# Patient Record
Sex: Female | Born: 1955
Health system: Southern US, Community
[De-identification: ages and names within clinical notes are randomized; demographics above are authoritative.]

## PROBLEM LIST (undated history)

## (undated) DIAGNOSIS — E7800 Pure hypercholesterolemia, unspecified: Secondary | ICD-10-CM

## (undated) DIAGNOSIS — C439 Malignant melanoma of skin, unspecified: Secondary | ICD-10-CM

## (undated) DIAGNOSIS — I10 Essential (primary) hypertension: Secondary | ICD-10-CM

## (undated) DIAGNOSIS — R51 Headache: Secondary | ICD-10-CM

## (undated) DIAGNOSIS — Z9981 Dependence on supplemental oxygen: Secondary | ICD-10-CM

## (undated) DIAGNOSIS — M67874 Other specified disorders of tendon, left ankle and foot: Secondary | ICD-10-CM

## (undated) DIAGNOSIS — C179 Malignant neoplasm of small intestine, unspecified: Secondary | ICD-10-CM

## (undated) HISTORY — PX: APPENDECTOMY: SHX54

## (undated) HISTORY — DX: Other specified disorders of tendon, left ankle and foot: M67.874

## (undated) HISTORY — DX: Malignant neoplasm of small intestine, unspecified: C17.9

## (undated) HISTORY — PX: OTHER SURGICAL HISTORY: SHX169

## (undated) HISTORY — PX: KNEE ARTHROSCOPY: SUR90

## (undated) HISTORY — DX: Headache: R51

## (undated) HISTORY — DX: Pure hypercholesterolemia, unspecified: E78.00

## (undated) HISTORY — DX: Malignant melanoma of skin, unspecified: C43.9

---

## 1996-11-13 DIAGNOSIS — C439 Malignant melanoma of skin, unspecified: Secondary | ICD-10-CM

## 1996-11-13 HISTORY — DX: Malignant melanoma of skin, unspecified: C43.9

## 1998-02-15 ENCOUNTER — Ambulatory Visit (HOSPITAL_COMMUNITY): Admission: RE | Admit: 1998-02-15 | Discharge: 1998-02-15 | Payer: Self-pay | Admitting: Obstetrics and Gynecology

## 2000-07-05 ENCOUNTER — Other Ambulatory Visit: Admission: RE | Admit: 2000-07-05 | Discharge: 2000-07-05 | Payer: Self-pay | Admitting: Obstetrics and Gynecology

## 2000-07-23 ENCOUNTER — Ambulatory Visit (HOSPITAL_COMMUNITY): Admission: RE | Admit: 2000-07-23 | Discharge: 2000-07-23 | Payer: Self-pay | Admitting: Obstetrics and Gynecology

## 2000-11-13 DIAGNOSIS — C179 Malignant neoplasm of small intestine, unspecified: Secondary | ICD-10-CM

## 2000-11-13 HISTORY — DX: Malignant neoplasm of small intestine, unspecified: C17.9

## 2001-02-06 ENCOUNTER — Encounter: Payer: Self-pay | Admitting: General Surgery

## 2001-02-06 ENCOUNTER — Inpatient Hospital Stay (HOSPITAL_COMMUNITY): Admission: EM | Admit: 2001-02-06 | Discharge: 2001-02-14 | Payer: Self-pay

## 2001-03-08 ENCOUNTER — Ambulatory Visit (HOSPITAL_COMMUNITY): Admission: RE | Admit: 2001-03-08 | Discharge: 2001-03-08 | Payer: Self-pay | Admitting: *Deleted

## 2001-03-08 ENCOUNTER — Encounter: Payer: Self-pay | Admitting: *Deleted

## 2001-07-16 ENCOUNTER — Other Ambulatory Visit: Admission: RE | Admit: 2001-07-16 | Discharge: 2001-07-16 | Payer: Self-pay | Admitting: Obstetrics and Gynecology

## 2002-09-02 ENCOUNTER — Other Ambulatory Visit: Admission: RE | Admit: 2002-09-02 | Discharge: 2002-09-02 | Payer: Self-pay | Admitting: Obstetrics and Gynecology

## 2005-05-30 ENCOUNTER — Other Ambulatory Visit: Admission: RE | Admit: 2005-05-30 | Discharge: 2005-05-30 | Payer: Self-pay | Admitting: Obstetrics and Gynecology

## 2006-07-05 ENCOUNTER — Ambulatory Visit: Payer: Self-pay | Admitting: Gastroenterology

## 2006-07-23 ENCOUNTER — Ambulatory Visit: Payer: Self-pay | Admitting: Gastroenterology

## 2010-03-18 DIAGNOSIS — H70009 Acute mastoiditis without complications, unspecified ear: Secondary | ICD-10-CM | POA: Insufficient documentation

## 2010-03-18 DIAGNOSIS — K5730 Diverticulosis of large intestine without perforation or abscess without bleeding: Secondary | ICD-10-CM | POA: Insufficient documentation

## 2010-08-12 DIAGNOSIS — Z2839 Other underimmunization status: Secondary | ICD-10-CM | POA: Insufficient documentation

## 2010-08-12 DIAGNOSIS — R3129 Other microscopic hematuria: Secondary | ICD-10-CM | POA: Insufficient documentation

## 2010-08-12 DIAGNOSIS — R030 Elevated blood-pressure reading, without diagnosis of hypertension: Secondary | ICD-10-CM | POA: Insufficient documentation

## 2011-08-21 DIAGNOSIS — E669 Obesity, unspecified: Secondary | ICD-10-CM | POA: Insufficient documentation

## 2011-11-14 DIAGNOSIS — R519 Headache, unspecified: Secondary | ICD-10-CM

## 2011-11-14 HISTORY — DX: Headache, unspecified: R51.9

## 2012-02-20 DIAGNOSIS — J189 Pneumonia, unspecified organism: Secondary | ICD-10-CM | POA: Insufficient documentation

## 2012-07-18 DIAGNOSIS — M5136 Other intervertebral disc degeneration, lumbar region: Secondary | ICD-10-CM | POA: Insufficient documentation

## 2012-11-27 DIAGNOSIS — I872 Venous insufficiency (chronic) (peripheral): Secondary | ICD-10-CM | POA: Insufficient documentation

## 2013-09-10 DIAGNOSIS — Z85820 Personal history of malignant melanoma of skin: Secondary | ICD-10-CM | POA: Insufficient documentation

## 2013-12-23 DIAGNOSIS — IMO0002 Reserved for concepts with insufficient information to code with codable children: Secondary | ICD-10-CM | POA: Insufficient documentation

## 2014-07-13 DIAGNOSIS — C4359 Malignant melanoma of other part of trunk: Secondary | ICD-10-CM | POA: Insufficient documentation

## 2014-07-31 DIAGNOSIS — L729 Follicular cyst of the skin and subcutaneous tissue, unspecified: Secondary | ICD-10-CM | POA: Insufficient documentation

## 2014-12-23 ENCOUNTER — Ambulatory Visit (INDEPENDENT_AMBULATORY_CARE_PROVIDER_SITE_OTHER): Payer: 59 | Admitting: Neurology

## 2014-12-23 ENCOUNTER — Telehealth: Payer: Self-pay

## 2014-12-23 ENCOUNTER — Encounter: Payer: Self-pay | Admitting: Neurology

## 2014-12-23 VITALS — BP 159/78 | HR 65 | Resp 14 | Ht 65.5 in | Wt 201.2 lb

## 2014-12-23 DIAGNOSIS — G4700 Insomnia, unspecified: Secondary | ICD-10-CM

## 2014-12-23 DIAGNOSIS — M1990 Unspecified osteoarthritis, unspecified site: Secondary | ICD-10-CM

## 2014-12-23 MED ORDER — AMITRIPTYLINE HCL 25 MG PO TABS: 25.0000 mg | ORAL_TABLET | Freq: Every day | ORAL | Status: DC

## 2014-12-23 NOTE — Telephone Encounter (Signed)
Rx has been re-sent to [place].

## 2014-12-23 NOTE — Progress Notes (Signed)
SLEEP MEDICINE CLINIC   Provider:  Larey Seat, M D  Referring Provider: Leanna Battles, MD Primary Care Physician:  No primary care provider on file.  Chief Complaint  Patient presents with  . NP [facility] Sleep consult    Rm 10, alone    HPI:  MALEAH Warren is a 59 y.o. caucasain, married female  And  seen here as a referral  from Dr. Philip Aspen for a sleep evaluation.   The patient reports that until 2012 she actually complained about inability to stay awake she would come home from work and literally had to fight sleepiness at the dinner table. Sometimes she would fall asleep in situations that she didn't intend to. Then a change happened and she states that she became more insomniac by the year 2012. There have been no medical condition or anything else that she could relate to triggering this change may be menopause. What was really am important to mention is that she had and left ankle surgery in 2014 and her orthopedist gave her alprazolam to help her sleep she took this medication for about a year and it worked fine for her but she now is in the process of slowly weaning herself to lower and lower doses. She did she stated that she doesn't feel that she is worried that her mind is racing at night she is completely relaxed she is also not overtly sleeping and daytime even if she didn't get a lot of sleep at night.  Her sleep habits are as follows: The patient relies on an alarm in the morning and rises usually around 8:00. She will have a small breakfast and drinks coffee in the morning. She commutes 20 minutes by car to work. At work she works in a [employer] she is not exposed to any daylight during these hours. She works 8 hours daily the earliest is usually 10 AM beginning, to 7 PM - but in some days she may be called in for a 1-10 shift. She yawns at work, but doesn't feel sleepy. She drinks rareley coffee in daytime. She never naps. She goes to bed at 11.30 Pm and  takes her benzo at 10 Pm. She will sleep through the night. No coturia, some snoring. Husband has confirmed this, but sleeps in a different bedroom. She wakes up restored in AM, no headches and no dry mouth.    In addition the patient stated that she begun having headaches in the year 2013 these headaches have occurred every day and seem to occur at the same time in onset that began as a couple of days per month and now are a daily headache. It used to started 1 PM now it stopped at 11 AM and she feels a pressure and throbbing in her right forehead and temple right above the eyebrow. Headaches last about 20 minutes they're not associated with nausea and they're not associated with photophobia. A CT was normal in 2013.      Review of Systems: Out of a complete 14 system review, the patient complains of only the following symptoms, and all other reviewed systems are negative. Snoring, insomnia, headaches.   Epworth score 1  , Fatigue severity score 11 ,  Geriatric depression score 3 points    History   Social History  . Marital Status: Married    Spouse Name: N/A  . Number of Children: 0  . Years of Education: HS grad   Occupational History  . Deli [employer]   Social History Main Topics  . Smoking status: Never Smoker   . Smokeless tobacco: Not on file  . Alcohol Use: 0.0 oz/week    0 Standard drinks or equivalent per week     Comment: occ  . Drug Use: No  . Sexual Activity: Not on file   Other Topics Concern  . Not on file   Social History Narrative   Caffeine 2 cups daily avg.    No family history on file.  No past medical history on file.  No past surgical history on file.  Current Outpatient Prescriptions  Medication Sig Dispense Refill  . celecoxib (CELEBREX) 200 MG capsule Take 200 mg by mouth daily.    . cholecalciferol (VITAMIN D) 1000 UNITS tablet Take 1,000 Units by mouth daily.    Marland Kitchen lisinopril (PRINIVIL,ZESTRIL) 5 MG tablet Take 5 mg by mouth daily.     Marland Kitchen lovastatin (MEVACOR) 40 MG tablet Take 40 mg by mouth 3 (three) times a week.     . triazolam (HALCION) 0.25 MG tablet Take 0.25 mg by mouth at bedtime as needed for sleep. 1/2 tab daily     No current facility-administered medications for this visit.    Allergies as of 12/23/2014  . (No Known Allergies)    Vitals: BP 159/78 mmHg  Pulse 65  Resp 14  Ht 5' 5.5" (1.664 m)  Wt 201 lb 3.2 oz (91.264 kg)  BMI 32.96 kg/m2  PF 5 L/min Last Weight:  Wt Readings from Last 1 Encounters:  12/23/14 201 lb 3.2 oz (91.264 kg)       Last Height:   Ht Readings from Last 1 Encounters:  12/23/14 5' 5.5" (1.664 m)    Physical exam:  General: The patient is awake, alert and appears not in acute distress. The patient is well groomed. Head: Normocephalic, atraumatic. Neck is supple. Mallampati 2 , tonils present.  neck circumference: 13.25 . Nasal airflow  unrestricted , TMJ is  evident . Retrognathia is  seen.  Cardiovascular:  Regular rate and rhythm, without  murmurs or carotid bruit, and without distended neck veins. Respiratory: Lungs are clear to auscultation. Skin: ankle edema, no  rash Trunk: BMI , normal posture.  Neurologic exam : The patient is awake and alert, oriented to place and time.   Memory subjective  described as intact. There is a normal attention span & concentration ability. Speech is fluent without  dysarthria, dysphonia or aphasia. Mood and affect are appropriate.  Cranial nerves: Pupils are equal and briskly reactive to light. Funduscopic exam without  evidence of pallor or edema. Extraocular movements in vertical and horizontal planes intact and without nystagmus. Visual fields by finger perimetry are intact. Hearing to finger rub intact.  Facial sensation intact to fine touch. Facial motor strength is symmetric and tongue and uvula move midline.  Motor exam:   Normal tone ,muscle bulk and symmetric , strength in all extremities. ROM restriction in ankle and  knee form osteoarthritis.   Sensory:  Fine touch, pinprick and vibration were tested in all extremities.  Proprioception is  normal.  Coordination: Rapid alternating movements in the fingers/hands is normal. Finger-to-nose maneuver normal without evidence of ataxia, dysmetria or tremor.  Gait and station: Patient walks without assistive device and is able unassisted to climb up to the exam table. Strength within normal limits. Stance is stable and normal.    Deep tendon reflexes: in the  upper and lower extremities are symmetric and intact. Babinski  maneuver response is upgoing- on the surgically altered  Left side.    Assessment:  After physical and neurologic examination, review of laboratory studies, imaging, neurophysiology testing and pre-existing records, assessment is :  A change from hypersomnia and excessive daytime sleepiness to insomnia not associated with sleepiness in daytime. She feels that even that she may get less hours of sleep she is not fatigued or drowsy in daytime. She has no urge to nap. Her husband has reported to her that she is snoring and when she was on a outing with girlfriends she was told the same. She has mild retrognathia which would promote snoring but does not necessarily constitutes a risk factor for apnea given her Mallampati is only grade 2.   1) I would like for this patient to undergo a home sleep test to see if any apnea is present or any tachybradycardia arrhythmia or another indication of physiological stress at night is present.  2) Her headaches seem not to be present in the morning and therefore are unlikely to be sleep related they occur mostly in the afternoon or mornings at work but also on days when she is not at work. I would consider this more of a classic tension component. It is remarkable that the headaches have a preferable side and seemed to dominate on the right. There are  daily. Her last sinus CT was in 2012 oh 2013. It may be worth  repeating it if we cannot control the headaches with some medication basic changes.   My suggestion #02 after a home sleep test is to start and use melatonin or Belsomra at night as a sleep aid to help the patient wean off benzodiazepines.   #2 it will help to establish some rituals or routines around bedtime such as dimming the lights not exposing herself to screen light TV etc. bluelight admitting screens. #3 she will continue to sleep in her cool, quiet and dark bedroom. She does not watch TV in the bedroom. She does not drink alcohol before she goes to bed. She has never smoked.   The patient was advised of the nature of the diagnosed sleep disorder , the treatment options and risks for general a health and wellness arising from not treating the condition. Visit duration was 35 minutes.   Plan:  Treatment plan and additional workup : HST,  Elavil low dose at night to replace  Alprazolam.  I recommend to try Melatonin, 5 mg , take up to 2 at night.       Asencion Partridge Sora Vrooman MD  12/23/2014

## 2014-12-23 NOTE — Telephone Encounter (Signed)
-----   Message from Liane Comber, [place] sent at 12/23/2014  1:11 PM EST ----- Hi Mikela, Senn [phone]   Dr. Dohmeier sent amitriptylline to [facility], meant to go to [facility].   Can you change and send off to them?  Thanks

## 2014-12-30 ENCOUNTER — Encounter: Payer: Self-pay | Admitting: Neurology

## 2015-02-17 ENCOUNTER — Encounter: Payer: 59 | Admitting: *Deleted

## 2015-02-19 ENCOUNTER — Ambulatory Visit (INDEPENDENT_AMBULATORY_CARE_PROVIDER_SITE_OTHER): Payer: 59 | Admitting: Neurology

## 2015-02-19 DIAGNOSIS — G4700 Insomnia, unspecified: Secondary | ICD-10-CM

## 2015-02-19 DIAGNOSIS — G478 Other sleep disorders: Secondary | ICD-10-CM | POA: Diagnosis not present

## 2015-02-19 DIAGNOSIS — R0683 Snoring: Secondary | ICD-10-CM

## 2015-02-19 DIAGNOSIS — M1990 Unspecified osteoarthritis, unspecified site: Secondary | ICD-10-CM

## 2015-02-22 NOTE — Sleep Study (Signed)
Please see the scanned sleep study interpretation located in the Procedure tab within the Chart Review section. 

## 2015-03-11 ENCOUNTER — Telehealth: Payer: Self-pay

## 2015-03-11 NOTE — Telephone Encounter (Signed)
Called pt to give sleep study results, no answer, left message to call back.

## 2015-03-18 ENCOUNTER — Telehealth: Payer: Self-pay

## 2015-03-18 NOTE — Telephone Encounter (Signed)
Called pt with sleep study results. Informed her that sleep study did not show any significant osa. However, her oxygen desaturation needs to be evaluated and addressed with pulmonology. Pt said she wants to talk to her PCP Dr. Sharlett Iles on her appt with him 5/25 before she asks for a referral to pulmonology. Encouraged pt to call back with any questions or concerns.

## 2015-06-16 DIAGNOSIS — R197 Diarrhea, unspecified: Secondary | ICD-10-CM | POA: Insufficient documentation

## 2015-06-16 DIAGNOSIS — R109 Unspecified abdominal pain: Secondary | ICD-10-CM | POA: Insufficient documentation

## 2015-06-16 DIAGNOSIS — R634 Abnormal weight loss: Secondary | ICD-10-CM | POA: Insufficient documentation

## 2015-07-09 DIAGNOSIS — R5383 Other fatigue: Secondary | ICD-10-CM | POA: Insufficient documentation

## 2016-01-18 DIAGNOSIS — Z0000 Encounter for general adult medical examination without abnormal findings: Secondary | ICD-10-CM | POA: Insufficient documentation

## 2016-02-02 NOTE — Telephone Encounter (Signed)
Received an office visit for pt with Dr. Sharlett Iles on 01/28/2016. Dr. Brett Fairy recommends a pulmonology consult (if not already established) and nocturnal oxygen at bedtime.

## 2016-02-15 ENCOUNTER — Other Ambulatory Visit: Payer: Self-pay

## 2016-02-15 DIAGNOSIS — E785 Hyperlipidemia, unspecified: Secondary | ICD-10-CM | POA: Insufficient documentation

## 2016-02-15 DIAGNOSIS — K5792 Diverticulitis of intestine, part unspecified, without perforation or abscess without bleeding: Secondary | ICD-10-CM | POA: Insufficient documentation

## 2016-02-15 DIAGNOSIS — G4730 Sleep apnea, unspecified: Secondary | ICD-10-CM | POA: Insufficient documentation

## 2016-02-15 DIAGNOSIS — M545 Low back pain, unspecified: Secondary | ICD-10-CM | POA: Insufficient documentation

## 2016-02-15 DIAGNOSIS — K219 Gastro-esophageal reflux disease without esophagitis: Secondary | ICD-10-CM | POA: Insufficient documentation

## 2016-02-15 DIAGNOSIS — E662 Morbid (severe) obesity with alveolar hypoventilation: Secondary | ICD-10-CM | POA: Insufficient documentation

## 2016-02-16 ENCOUNTER — Ambulatory Visit (INDEPENDENT_AMBULATORY_CARE_PROVIDER_SITE_OTHER): Payer: Managed Care, Other (non HMO) | Admitting: Pulmonary Disease

## 2016-02-16 ENCOUNTER — Encounter: Payer: Self-pay | Admitting: Pulmonary Disease

## 2016-02-16 VITALS — BP 138/76 | HR 78 | Ht 64.0 in | Wt 199.4 lb

## 2016-02-16 DIAGNOSIS — R911 Solitary pulmonary nodule: Secondary | ICD-10-CM

## 2016-02-16 DIAGNOSIS — E662 Morbid (severe) obesity with alveolar hypoventilation: Secondary | ICD-10-CM | POA: Diagnosis not present

## 2016-02-16 NOTE — Patient Instructions (Signed)
We will schedule you for pulmonary function tests and an arterial blood gas. You will be scheduled for a CT of the chest without contrast. Will start home oxygen at 2 L. And an overnight oximetry will be performed.  Return to clinic in 2 months.

## 2016-02-16 NOTE — Progress Notes (Signed)
Subjective:    Patient ID: Jill Warren, female    DOB: [birth year], 60 y.o.   MRN: [id number]  HPI Consult for evaluation of nocturnal hypoxemia.  Mrs. Freytes is a 60 year old with past medical history of melanoma resection, hyperlipidemia. She was evaluated for headaches by sleep study last year which did not show any sleep apnea but prolonged desaturation to 85%. She is referred to [place] for further evaluation of any pulmonary abnormalities. She is already been started on 2 L oxygen at home and referred to the sleep clinic by her PCP, Dr. Philip Aspen.  She continues to have headaches at morning and occasionally during the daytime as well. She denies any pulmonary complaints of cough, sputum production, dyspnea, wheezing.  She said history of melanoma in the past and was followed at [facility] with follow-up CT scans. Her CT scan in 2013 did not show any pulmonary interstitial infiltrates. However she had a pulmonary nodule in the right middle lobe that needs further follow-up.   DATA: Sleep study 02/20/15 AHI 1.5, RDI 1.5. The lowest oxygen desaturation was 84% with 69 minutes of desaturation between 60-90%.  CT chest 06/04/12 Right middle lobe mixed attenuation groundglass pulmonary nodule, measuring approximately 6 mm (series 3, image 142), previously 6 mm. No consolidative airspace disease. No suspicious appearing pulmonary nodules or masses. No pleural effusions.  Labs [id number] CBC, metabolic panel within normal limits. Bicarbonate-26  Social History:  She is a never smoker, occasional alcohol, drug use  Family History: Father- cancer Sister- lymphoma  Past Medical History  Diagnosis Date  . High cholesterol   . Melanoma ([place]) 1998    Skin started  . Cancer of small intestine ([place]) 2002  . Other specified disorders of tendon, left ankle and foot     ruptured 2014  . Headache 2013    headaches    Current outpatient prescriptions:  .  bifidobacterium infantis  (ALIGN) capsule, Take 1 capsule by mouth daily., Disp: 30 capsule, Rfl: 0 .  celecoxib (CELEBREX) 200 MG capsule, Take 200 mg by mouth daily., Disp: , Rfl:  .  cholecalciferol (VITAMIN D) 1000 UNITS tablet, Take 1,000 Units by mouth daily., Disp: , Rfl:  .  estrogens, conjugated, (PREMARIN) 0.625 MG tablet, Take 1 tablet (0.625 mg total) by mouth daily. Take daily for 21 days then do not take for 7 days., Disp: 30 tablet, Rfl: 0 .  lisinopril (PRINIVIL,ZESTRIL) 5 MG tablet, Take 5 mg by mouth daily., Disp: , Rfl:  .  lovastatin (MEVACOR) 40 MG tablet, Take 40 mg by mouth 3 (three) times a week. , Disp: , Rfl:  .  PREMARIN vaginal cream, TAKE 1 APPLICATOR INTRAVAGINALLY TWICE WEEKLY, Disp: , Rfl: 3 .  topiramate (TOPAMAX) 25 MG tablet, Take 1 tablet (25 mg total) by mouth daily. At bedtime, Disp: 30 tablet, Rfl: 0 .  triazolam (HALCION) 0.25 MG tablet, Take 0.25 mg by mouth at bedtime as needed for sleep. 1/2 tab daily, Disp: , Rfl:   Review of Systems Headaches. No syncope, dizziness, vision changes, numbness, tingling, weakness. No snoring, daytime sleepiness, fatigue. No dyspnea, wheezing, cough, sputum production, hemoptysis. No chest pain, palpitation. No fevers, chills, loss of weight, loss of appetite. No nausea, vomiting, diarrhea, constipation. All other review of systems are negative    Objective:   Physical Exam Blood pressure 138/76, pulse 78, height 5\' 4"  (1.626 m), weight 199 lb 6.4 oz (90.447 kg), SpO2 100 %. Gen: No apparent distress Neuro: No gross focal  deficits. HEENT: No JVD, lymphadenopathy, thyromegaly. RS: Clear, No wheeze or crackles CVS: S1-S2 heard, no murmurs rubs gallops. Abdomen: Soft, positive bowel sounds. Musculoskeletal: No edema.    Assessment & Plan:  Nocturnal desaturation. She may have obesity hypoventilation syndrome but it is unusual to have this without coexisting OSA. Review of her recent blood work does not show elevated bicarbonate suggestive  of chronic hypercarbia. She does not have pulmonary interstitial disease at least on CT imaging in 2013. However she has a lung nodule that needs further follow-up.  I will evaluate further with a set of PFTs, ABG to check for hypercarbia. She'll be scheduled for a follow-up CT of the chest. She has already been ordered for nocturnal O2 and sleep follow-up.  Plan: - PFTs, ABG - CT of chest - Follow up sleep eval.  Marshell Garfinkel MD [facility] Pager [phone] If no answer or after 3pm call: [phone] 02/16/2016, 5:28 PM

## 2016-02-17 ENCOUNTER — Telehealth: Payer: Self-pay | Admitting: Pulmonary Disease

## 2016-02-17 ENCOUNTER — Ambulatory Visit (HOSPITAL_COMMUNITY)
Admission: RE | Admit: 2016-02-17 | Discharge: 2016-02-17 | Disposition: A | Discharge status: Home / Self Care | Payer: Managed Care, Other (non HMO) | Source: Ambulatory Visit | Attending: Pulmonary Disease | Admitting: Pulmonary Disease

## 2016-02-17 DIAGNOSIS — R911 Solitary pulmonary nodule: Secondary | ICD-10-CM | POA: Insufficient documentation

## 2016-02-17 LAB — BLOOD GAS, ARTERIAL
ACID-BASE EXCESS: 0.8 mmol/L (ref 0.0–2.0)
Bicarbonate: 23.5 mEq/L (ref 20.0–24.0)
DRAWN BY: 244901
FIO2: 0.21
O2 Saturation: 98.4 %
PATIENT TEMPERATURE: 98.6
PH ART: 7.466 — AB (ref 7.350–7.450)
TCO2: 20.4 mmol/L (ref 0–100)
pCO2 arterial: 33.1 mmHg — ABNORMAL LOW (ref 35.0–45.0)
pO2, Arterial: 117 mmHg — ABNORMAL HIGH (ref 80.0–100.0)

## 2016-02-17 LAB — PULMONARY FUNCTION TEST
DL/VA % PRED: 113 %
DL/VA: 5.44 ml/min/mmHg/L
DLCO COR % PRED: 99 %
DLCO COR: 24.19 ml/min/mmHg
DLCO UNC % PRED: 101 %
DLCO UNC: 24.69 ml/min/mmHg
FEF 25-75 POST: 2.42 L/s
FEF 25-75 PRE: 2.46 L/s
FEF2575-%CHANGE-POST: -1 %
FEF2575-%PRED-POST: 103 %
FEF2575-%PRED-PRE: 105 %
FEV1-%Change-Post: 2 %
FEV1-%PRED-POST: 93 %
FEV1-%Pred-Pre: 91 %
FEV1-PRE: 2.32 L
FEV1-Post: 2.38 L
FEV1FVC-%CHANGE-POST: 3 %
FEV1FVC-%PRED-PRE: 99 %
FEV6-%CHANGE-POST: 4 %
FEV6-%PRED-POST: 94 %
FEV6-%Pred-Pre: 89 %
FEV6-PRE: 2.84 L
FEV6-Post: 2.98 L
FEV6FVC-%CHANGE-POST: 0 %
FEV6FVC-%PRED-PRE: 104 %
FEV6FVC-%Pred-Post: 103 %
FVC-%Change-Post: 0 %
FVC-%Pred-Post: 90 %
FVC-%Pred-Pre: 91 %
FVC-Post: 2.99 L
FVC-Pre: 2.99 L
POST FEV1/FVC RATIO: 80 %
PRE FEV1/FVC RATIO: 77 %
Post FEV6/FVC ratio: 100 %
Pre FEV6/FVC Ratio: 100 %
RV % PRED: 101 %
RV: 2.04 L
TLC % pred: 100 %
TLC: 5.07 L

## 2016-02-17 MED ORDER — ALBUTEROL SULFATE (2.5 MG/3ML) 0.083% IN NEBU
2.5000 mg | INHALATION_SOLUTION | Freq: Once | RESPIRATORY_TRACT | Status: AC
  Administered 2016-02-17: 2.5 mg via RESPIRATORY_TRACT

## 2016-02-17 NOTE — Telephone Encounter (Signed)
[facility]. Office is closed. WCB on 4/7.

## 2016-02-18 NOTE — Telephone Encounter (Signed)
DJ returned call.  They did order O2 on pt (just yesterday) to [place] but was was told that O2 had already been ordered through this office to [place].  Dr Philip Aspen had ordered the O2 from the sleep study done by Dr Brett Fairy, an ONO was not done.  Will sign and forward to PM as FYI.

## 2016-02-18 NOTE — Telephone Encounter (Signed)
Yes, PM had asked that Dr Kaylyn Lim office be called to verify that nocturnal O2 was ordered on this patient Have LMOM TCB x1 for Dr Shon Baton nurse Radonna Ricker

## 2016-02-18 NOTE — Telephone Encounter (Signed)
lmtcb X1 with clinical assistant at [facility] to follow up on.   Jess, please advise if you can provide some clarification on this message-thanks!

## 2016-02-29 ENCOUNTER — Ambulatory Visit (INDEPENDENT_AMBULATORY_CARE_PROVIDER_SITE_OTHER)
Admission: RE | Admit: 2016-02-29 | Discharge: 2016-02-29 | Disposition: A | Discharge status: Home / Self Care | Payer: Managed Care, Other (non HMO) | Source: Ambulatory Visit | Attending: Pulmonary Disease | Admitting: Pulmonary Disease

## 2016-02-29 DIAGNOSIS — R911 Solitary pulmonary nodule: Secondary | ICD-10-CM | POA: Diagnosis not present

## 2016-03-03 ENCOUNTER — Encounter: Payer: Self-pay | Admitting: Pulmonary Disease

## 2016-03-03 ENCOUNTER — Telehealth: Payer: Self-pay | Admitting: Pulmonary Disease

## 2016-03-03 DIAGNOSIS — R911 Solitary pulmonary nodule: Secondary | ICD-10-CM

## 2016-03-03 NOTE — Progress Notes (Signed)
Quick Note:  lmtcb for pt. ______ 

## 2016-03-03 NOTE — Progress Notes (Signed)
Quick Note:  PM would also like these results routed to pt's PCP ______

## 2016-03-03 NOTE — Progress Notes (Unsigned)
ONO on 2 lt O2 02/28/15 Duration 7:49 Time < 88% 0.3 mins Time less than 89% 0.5 mins  O2 levels are OK on current O2 prescription. No change needed. Will inform the pt, PCP and scan study into chart.

## 2016-03-03 NOTE — Telephone Encounter (Signed)
4.17.17 ONO results received and reviewed by PM: No changed needed in therapy  LMOM TCB x1  Will also need to inform pt of PFT and CT results: Result Notes       Notes Recorded by Rinaldo Ratel, CMA on 03/03/2016 at 12:35 PM PM would also like these results routed to pt's PCP ------  Notes Recorded by Collier Salina, RN on 03/03/2016 at 12:00 PM lmtcb for pt. ------  Notes Recorded by Marshell Garfinkel, MD on 03/02/2016 at 1:02 PM Please let the pt know that the CT shows the pulmonary nodule looks stable. We will follow with a repeat CT in 1 year. Can we get the images from the CT in 2013. It was done at [facility].   Notes Recorded by Collier Salina, RN on 03/03/2016 at 11:56 AM lmtcb for pt. Notes Recorded by Marshell Garfinkel, MD on 03/03/2016 at 8:44 AM Please let the pt know that her PFTs are normal. The ABG shows hyper ventilation which indicates that she was breathing fast. This is mild and no intervention is needed

## 2016-03-06 NOTE — Progress Notes (Signed)
Quick Note:  Please see phone note from 4.21.17. Will sign off. ______ 

## 2016-03-06 NOTE — Telephone Encounter (Signed)
Pt returned call. Informed her of the results and recs per PM. Order placed for 1 year f/u CT chest. Reports have been routed to [facility]. Pt verbalized understanding and denied any questions or concerns at this time. Line was then disconnected. I overlooked the need for CT chest images from [place] in 2013 as the report is in [place].   LMTCB for pt to have pt sign release form.

## 2016-03-24 NOTE — Telephone Encounter (Signed)
LMOM TCB x2

## 2016-03-28 ENCOUNTER — Telehealth: Payer: Self-pay | Admitting: Pulmonary Disease

## 2016-03-28 NOTE — Telephone Encounter (Signed)
LMOM TCB x3 Will close message and send letter to pt's home

## 2016-03-28 NOTE — Telephone Encounter (Signed)
Patient returned call, CB [phone].  I read her comments below about coming in to sign release forms.  She said she thought someone told her that we would mail those to her?  Please call to clarify.

## 2016-03-28 NOTE — Telephone Encounter (Signed)
Pt states that she is going to try and come this week either Thursday or Friday to sign Records Release for [facility] so that we can get her CT results.  Please advise Janett Billow if you have a release already filled out with specifications as to what is needed. Thanks.

## 2016-03-28 NOTE — Telephone Encounter (Signed)
LMTCB Pt needs to come in and sign records release form for [facility] to allow [place] to get CT images from 2013 per PM request.

## 2016-03-29 NOTE — Telephone Encounter (Signed)
Patient called and states she will be here around 10:00 am this morning to fill out form.

## 2016-03-29 NOTE — Telephone Encounter (Signed)
Noted, thank you. Will sign off but keep in my inbasket to await records.

## 2016-03-29 NOTE — Telephone Encounter (Signed)
Pt came in to sign ROI.  This has been filled out and faxed to [facility].  Will await records.

## 2016-04-05 ENCOUNTER — Encounter: Payer: Self-pay | Admitting: Pulmonary Disease

## 2016-04-12 NOTE — Telephone Encounter (Signed)
Records received and placed in PM's lookat for review/scan

## 2016-04-19 IMAGING — CT CT CHEST W/O CM
2 of 3 series · 15 of 36 positions shown, 18 images · non-contrast
Comparison: Report from outside 06/04/2012 chest CT (images not
available).

CLINICAL DATA: Follow-up right middle lobe 6 mm ground-glass
pulmonary nodule. History of melanoma.

EXAM:
CT CHEST WITHOUT CONTRAST
TECHNIQUE: Multidetector CT imaging of the chest was performed following the
standard protocol without IV contrast.

[Series 2: thorax · axial · 0.70mm/px · z∈[-294,-54]mm · 12 of 142 slices shown, 15 images]
[im 11/142  mediastinal]
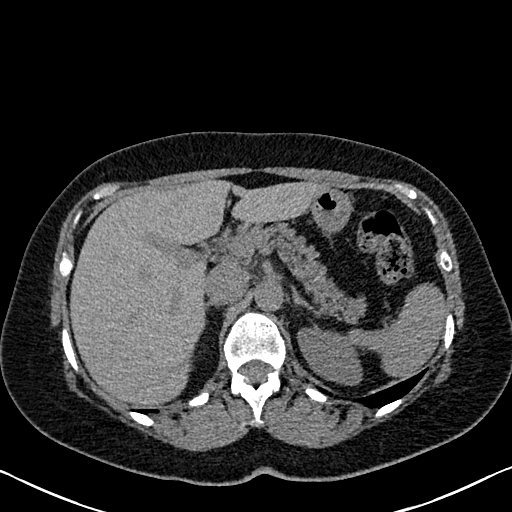
[im 11/142  lung]
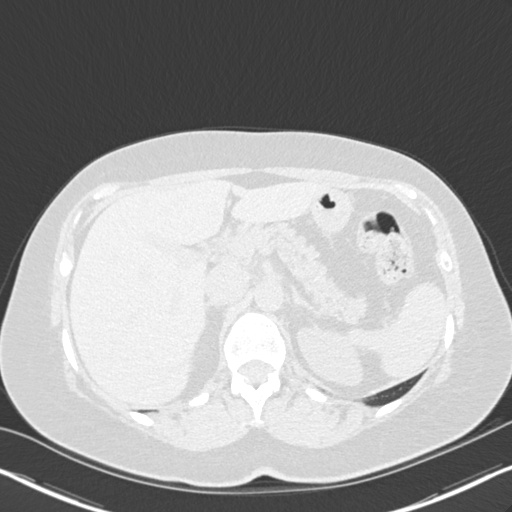
[im 21/142  lung]
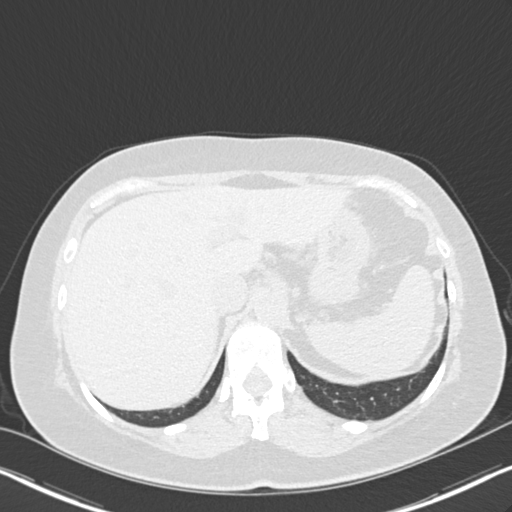
[im 32/142  lung]
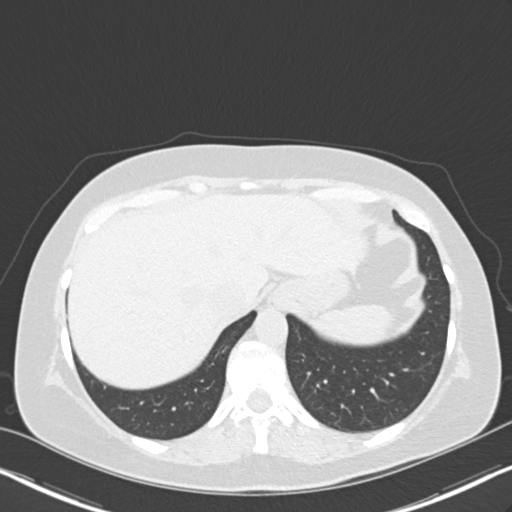
[im 42/142  lung]
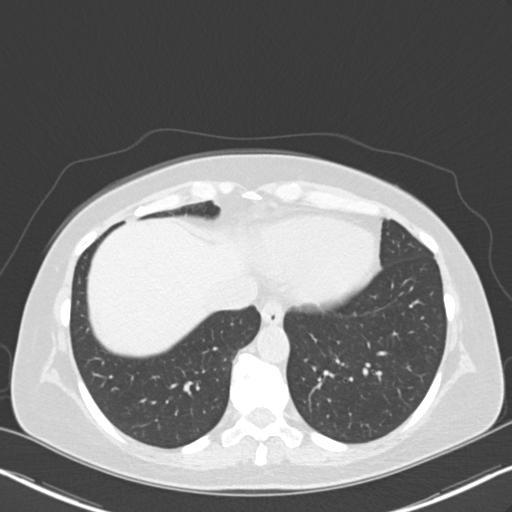
[im 53/142  mediastinal]
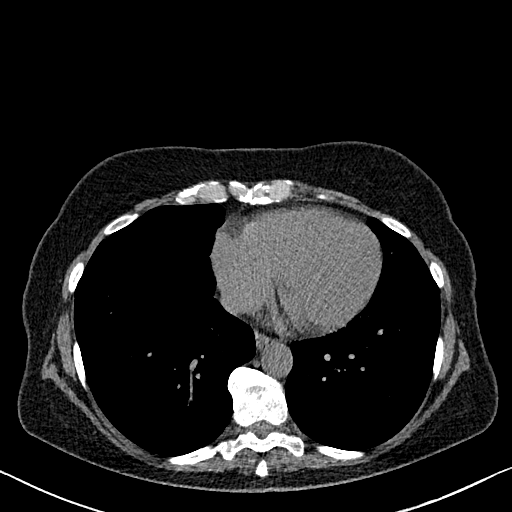
[im 53/142  lung]
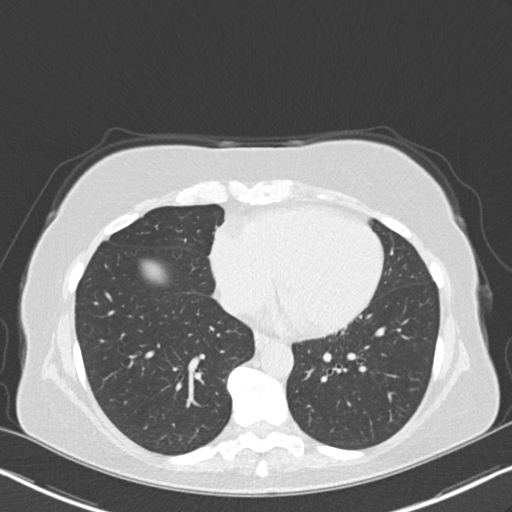
[im 63/142  lung]
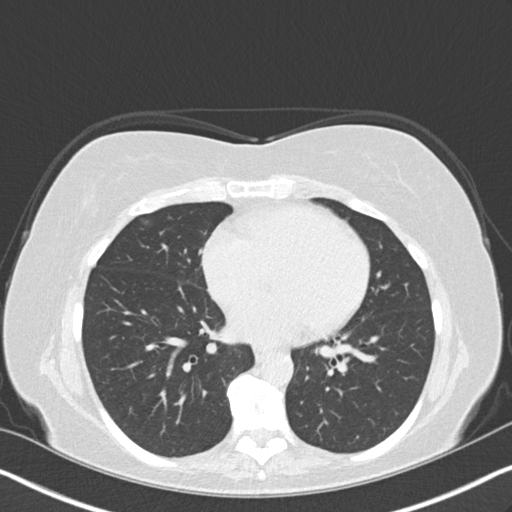
[im 79/142  lung]
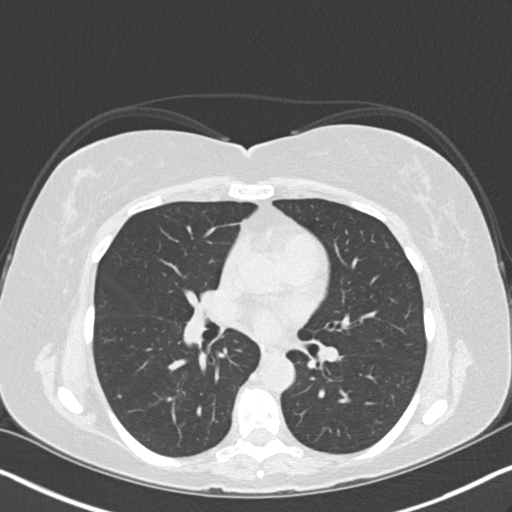
[im 89/142  lung]
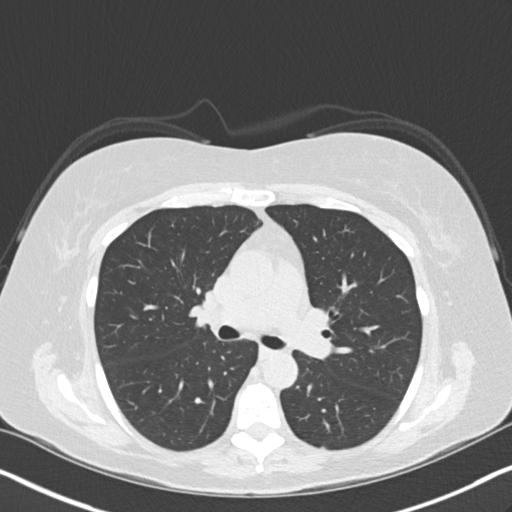
[im 100/142  mediastinal]
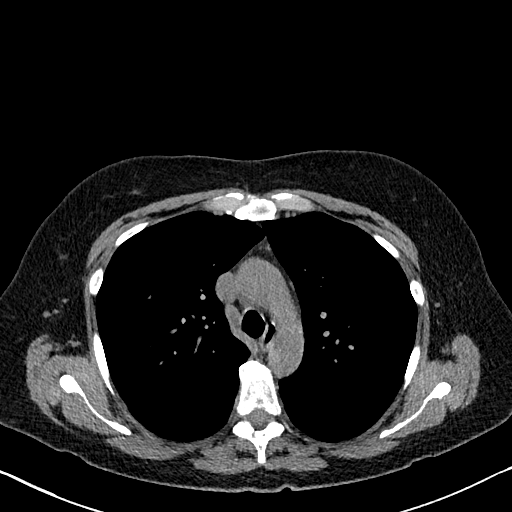
[im 100/142  lung]
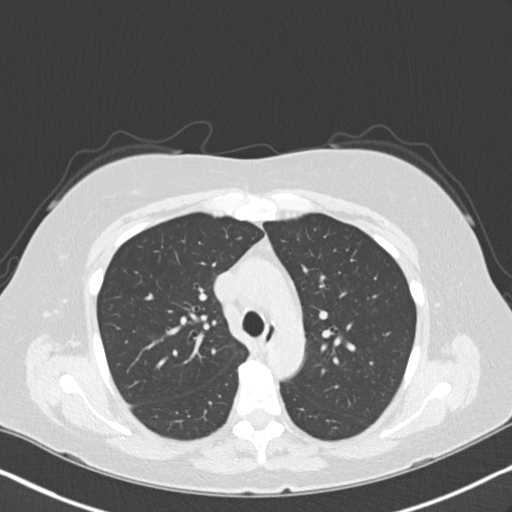
[im 110/142  lung]
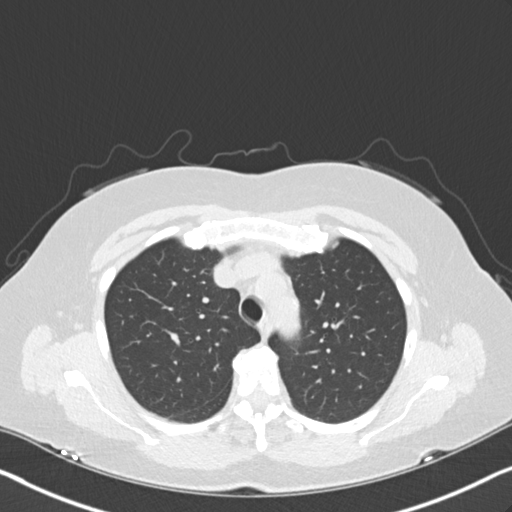
[im 121/142  lung]
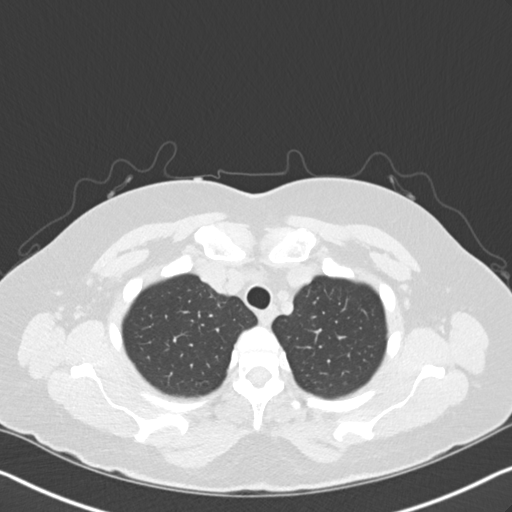
[im 131/142  lung]
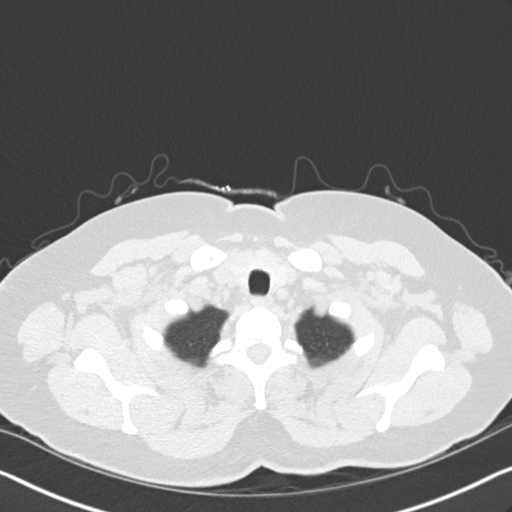

[Series 5: coronal · coronal · 0.59mm/px · 3 of 112 slices shown]
[im 23/112  lung]
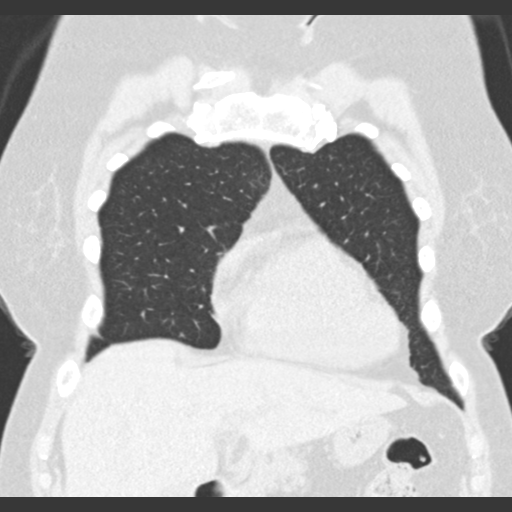
[im 45/112  lung]
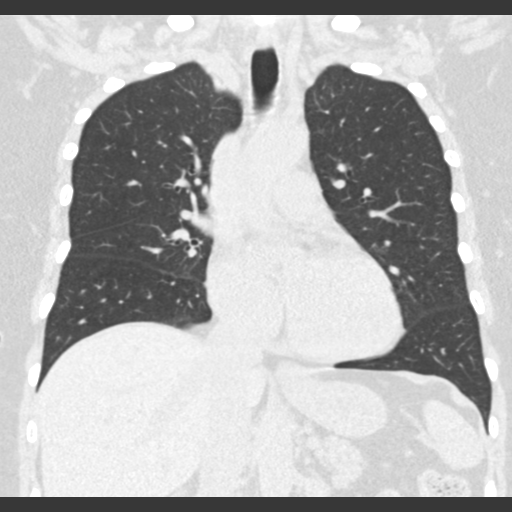
[im 67/112  lung]
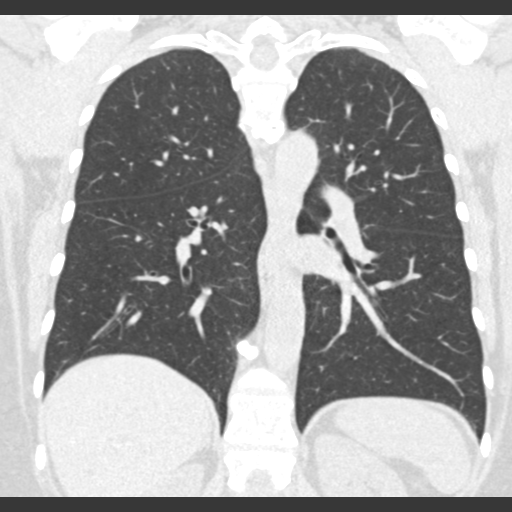

[15 of 36 positions shown; findings below may reference images not displayed]

FINDINGS: Mediastinum/Nodes: Normal heart size. No pericardial
fluid/thickening. Left anterior descending coronary atherosclerosis.
Great vessels are normal in course and caliber. Normal visualized
thyroid. Normal esophagus. No pathologically enlarged axillary,
mediastinal or gross hilar lymph nodes, noting limited sensitivity
for the detection of hilar adenopathy on this noncontrast study.

Lungs/Pleura: No pneumothorax. No pleural effusion. There is a 9 x 6
mm ground-glass pulmonary nodule in the right middle lobe (series 3/
image 81). No acute consolidative airspace disease, additional
significant pulmonary nodules or lung masses.

Upper abdomen: Unremarkable.

Musculoskeletal: No aggressive appearing focal osseous lesions.
Moderate degenerative changes in the thoracic spine.
IMPRESSION: 1. Solitary 9 x 6 mm right middle lobe ground-glass pulmonary
nodule, which is mildly increased in size compared to the 06/04/2012
chest CT report, although cannot exclude artifactual change due to
differences in measurement technique. An addendum could be issued if
the outside chest CT is submitted for direct comparison. If the
nodule has truly increased in size, a follow-up chest CT is
recommended in 6-12 months.
2. One vessel coronary atherosclerosis.

## 2016-04-20 ENCOUNTER — Ambulatory Visit (INDEPENDENT_AMBULATORY_CARE_PROVIDER_SITE_OTHER): Payer: Managed Care, Other (non HMO) | Admitting: Pulmonary Disease

## 2016-04-20 ENCOUNTER — Encounter: Payer: Self-pay | Admitting: Pulmonary Disease

## 2016-04-20 VITALS — BP 130/86 | HR 87 | Ht 63.0 in | Wt 199.0 lb

## 2016-04-20 DIAGNOSIS — R911 Solitary pulmonary nodule: Secondary | ICD-10-CM | POA: Diagnosis not present

## 2016-04-20 NOTE — Progress Notes (Signed)
Subjective:    Patient ID: Jill Warren, female    DOB: 1956-01-28, 60 y.o.   MRN: [id number]  HPI Follow up for nocturnal hypoxemia.  Jill Warren is a 60 year old with past medical history of melanoma resection, hyperlipidemia. She was evaluated for headaches by sleep study last year which did not show any sleep apnea but prolonged desaturation to 85%. She is referred to [place] for further evaluation of any pulmonary abnormalities. She is already been started on 2 L oxygen at home and referred to the sleep clinic by her PCP, Dr. Philip Aspen.  She continues to have headaches at morning and occasionally during the daytime as well. She denies any pulmonary complaints of cough, sputum production, dyspnea, wheezing.  She said history of melanoma in the past and was followed at [facility] with follow-up CT scans. Her CT scan in 2013 did not show any pulmonary interstitial infiltrates. However she had a pulmonary nodule in the right middle lobe that needs further follow-up.   DATA: Sleep study 02/20/15 AHI 1.5, RDI 1.5. The lowest oxygen desaturation was 84% with 69 minutes of desaturation between 60-90%.  CT chest 06/04/12 Right middle lobe mixed attenuation groundglass pulmonary nodule, measuring approximately 6 mm (series 3, image 142), previously 6 mm. No consolidative airspace disease. No suspicious appearing pulmonary nodules or masses. No pleural effusions.  CT chest 02/29/16 Right middle lobe nodule.Looks stable  Labs [id number] CBC, metabolic panel within normal limits. Bicarbonate-26  ABG 02/17/16- 7.46/33/117/98  PFTs. 6/17 FVC 2.99 [91%)  FEV1 2.8 (91%) F/F 77 TLC 100%  DLCO 101%. Normal study.  Social History:  She is a never smoker, occasional alcohol, drug use  Family History: Father- cancer Sister- lymphoma  Past Medical History  Diagnosis Date  . High cholesterol   . Melanoma ([place]) 1998    Skin started  . Cancer of small intestine ([place]) 2002  . Other  specified disorders of tendon, left ankle and foot     ruptured 2014  . Headache 2013    headaches    Current outpatient prescriptions:  .  bifidobacterium infantis (ALIGN) capsule, Take 1 capsule by mouth daily., Disp: 30 capsule, Rfl: 0 .  celecoxib (CELEBREX) 200 MG capsule, Take 200 mg by mouth daily., Disp: , Rfl:  .  cholecalciferol (VITAMIN D) 1000 UNITS tablet, Take 1,000 Units by mouth daily., Disp: , Rfl:  .  lisinopril (PRINIVIL,ZESTRIL) 5 MG tablet, Take 5 mg by mouth daily., Disp: , Rfl:  .  lovastatin (MEVACOR) 40 MG tablet, Take 40 mg by mouth 3 (three) times a week. , Disp: , Rfl:  .  PREMARIN vaginal cream, TAKE 1 APPLICATOR INTRAVAGINALLY TWICE WEEKLY, Disp: , Rfl: 3 .  topiramate (TOPAMAX) 25 MG tablet, Take 1 tablet (25 mg total) by mouth daily. At bedtime, Disp: 30 tablet, Rfl: 0 .  triazolam (HALCION) 0.25 MG tablet, Take 0.25 mg by mouth at bedtime as needed for sleep. 1/2 tab daily, Disp: , Rfl:   Review of Systems Headaches. No syncope, dizziness, vision changes, numbness, tingling, weakness. No snoring, daytime sleepiness, fatigue. No dyspnea, wheezing, cough, sputum production, hemoptysis. No chest pain, palpitation. No fevers, chills, loss of weight, loss of appetite. No nausea, vomiting, diarrhea, constipation. All other review of systems are negative    Objective:   Physical Exam Blood pressure 138/76, pulse 78, height 5\' 4"  (1.626 m), weight 199 lb 6.4 oz (90.447 kg), SpO2 100 %. Gen: No apparent distress Neuro: No gross focal deficits. HEENT: No  JVD, lymphadenopathy, thyromegaly. RS: Clear, No wheeze or crackles CVS: S1-S2 heard, no murmurs rubs gallops. Abdomen: Soft, positive bowel sounds. Musculoskeletal: No edema.    Assessment & Plan:  #1 Nocturnal desaturation. Review of her recent blood work does not show elevated bicarbonate suggestive of chronic hypercarbia. She does not have pulmonary interstitial disease. PFTs, ABG reviewed with her  which are normal. There is no evidence of OSA/OHS. She will continue the nocturnal O2. We will reassess with an overnight oximetry next year  #2 Lung nodule Likely stable on CT. We will follow up with repeat CT next year  Plan: - CT of chest next year - Continue nocturnal O2. Repeat ONO next year  Marshell Garfinkel MD [facility] Pager [phone] If no answer or after 3pm call: [phone] 04/20/2016, 3:49 PM

## 2016-04-20 NOTE — Patient Instructions (Signed)
We will schedule a follow-up CT without contrast in 1 year. Return to clinic after the CT for review. Continue using home oxygen at night.

## 2016-05-31 ENCOUNTER — Encounter: Payer: Self-pay | Admitting: Internal Medicine

## 2016-06-05 ENCOUNTER — Encounter: Payer: Self-pay | Admitting: Gastroenterology

## 2016-06-16 ENCOUNTER — Other Ambulatory Visit: Payer: Self-pay | Admitting: Internal Medicine

## 2016-06-16 DIAGNOSIS — R519 Headache, unspecified: Secondary | ICD-10-CM

## 2016-06-16 DIAGNOSIS — R51 Headache: Principal | ICD-10-CM

## 2016-06-19 ENCOUNTER — Ambulatory Visit
Admission: RE | Admit: 2016-06-19 | Discharge: 2016-06-19 | Disposition: A | Discharge status: Home / Self Care | Payer: Managed Care, Other (non HMO) | Source: Ambulatory Visit | Attending: Internal Medicine | Admitting: Internal Medicine

## 2016-06-19 DIAGNOSIS — R51 Headache: Principal | ICD-10-CM

## 2016-06-19 DIAGNOSIS — R519 Headache, unspecified: Secondary | ICD-10-CM

## 2016-06-19 MED ORDER — GADOBENATE DIMEGLUMINE 529 MG/ML IV SOLN
18.0000 mL | Freq: Once | INTRAVENOUS | Status: AC | PRN
  Administered 2016-06-19: 18 mL via INTRAVENOUS

## 2016-07-03 DIAGNOSIS — J069 Acute upper respiratory infection, unspecified: Secondary | ICD-10-CM | POA: Insufficient documentation

## 2016-08-22 ENCOUNTER — Encounter: Payer: Self-pay | Admitting: Neurology

## 2016-08-22 ENCOUNTER — Ambulatory Visit (INDEPENDENT_AMBULATORY_CARE_PROVIDER_SITE_OTHER): Payer: 59 | Admitting: Neurology

## 2016-08-22 VITALS — BP 144/82 | HR 76 | Resp 20 | Ht 63.0 in | Wt 197.0 lb

## 2016-08-22 DIAGNOSIS — G44209 Tension-type headache, unspecified, not intractable: Secondary | ICD-10-CM

## 2016-08-22 DIAGNOSIS — G44019 Episodic cluster headache, not intractable: Secondary | ICD-10-CM | POA: Diagnosis not present

## 2016-08-22 MED ORDER — ZONISAMIDE 25 MG PO CAPS: 25.0000 mg | ORAL_CAPSULE | Freq: Every day | ORAL | 2 refills | Status: DC

## 2016-08-22 MED ORDER — CYCLOBENZAPRINE HCL 5 MG PO TABS: ORAL_TABLET | ORAL | 3 refills | Status: DC

## 2016-08-22 NOTE — Patient Instructions (Addendum)
Cyclobenzaprine tablets What is this medicine? CYCLOBENZAPRINE (sye kloe BEN za preen) is a muscle relaxer. It is used to treat muscle pain, spasms, and stiffness. This medicine may be used for other purposes; ask your health care provider or pharmacist if you have questions. What should I tell my health care provider before I take this medicine? They need to know if you have any of these conditions: -heart disease, irregular heartbeat, or previous heart attack -liver disease -thyroid problem -an unusual or allergic reaction to cyclobenzaprine, tricyclic antidepressants, lactose, other medicines, foods, dyes, or preservatives -pregnant or trying to get pregnant -breast-feeding How should I use this medicine? Take this medicine by mouth with a glass of water. Follow the directions on the prescription label. If this medicine upsets your stomach, take it with food or milk. Take your medicine at regular intervals. Do not take it more often than directed. Talk to your pediatrician regarding the use of this medicine in children. Special care may be needed. Overdosage: If you think you have taken too much of this medicine contact a poison control center or emergency room at once. NOTE: This medicine is only for you. Do not share this medicine with others. What if I miss a dose? If you miss a dose, take it as soon as you can. If it is almost time for your next dose, take only that dose. Do not take double or extra doses. What may interact with this medicine? Do not take this medicine with any of the following medications: -certain medicines for fungal infections like fluconazole, itraconazole, ketoconazole, posaconazole, voriconazole -cisapride -dofetilide -dronedarone -droperidol -flecainide -grepafloxacin -halofantrine -levomethadyl -MAOIs like Carbex, Eldepryl, Marplan, Nardil, and Parnate -nilotinib -pimozide -probucol -sertindole -thioridazine -ziprasidone This medicine may also  interact with the following medications: -abarelix -alcohol -certain medicines for cancer -certain medicines for depression, anxiety, or psychotic disturbances -certain medicines for infection like alfuzosin, chloroquine, clarithromycin, levofloxacin, mefloquine, pentamidine, troleandomycin -certain medicines for an irregular heart beat -certain medicines used for sleep or numbness during surgery or procedure -contrast dyes -dolasetron -guanethidine -methadone -octreotide -ondansetron -other medicines that prolong the QT interval (cause an abnormal heart rhythm) -palonosetron -phenothiazines like chlorpromazine, mesoridazine, prochlorperazine, thioridazine -tramadol -vardenafil This list may not describe all possible interactions. Give your health care provider a list of all the medicines, herbs, non-prescription drugs, or dietary supplements you use. Also tell them if you smoke, drink alcohol, or use illegal drugs. Some items may interact with your medicine. What should I watch for while using this medicine? Check with your doctor or health care professional if your condition does not improve within 1 to 3 weeks. You may get drowsy or dizzy when you first start taking the medicine or change doses. Do not drive, use machinery, or do anything that may be dangerous until you know how the medicine affects you. Stand or sit up slowly. Your mouth may get dry. Drinking water, chewing sugarless gum, or sucking on hard candy may help. What side effects may I notice from receiving this medicine? Side effects that you should report to your doctor or health care professional as soon as possible: -allergic reactions like skin rash, itching or hives, swelling of the face, lips, or tongue -chest pain -fast heartbeat -hallucinations -seizures -vomiting Side effects that usually do not require medical attention (report to your doctor or health care professional if they continue or are  bothersome): -headache This list may not describe all possible side effects. Call your doctor for medical advice about side effects.   You may report side effects to FDA at 1-800-FDA-1088. Where should I keep my medicine? Keep out of the reach of children. Store at room temperature between 15 and 30 degrees C (59 and 86 degrees F). Keep container tightly closed. Throw away any unused medicine after the expiration date. NOTE: This sheet is a summary. It may not cover all possible information. If you have questions about this medicine, talk to your doctor, pharmacist, or health care provider.    2016, Elsevier/Gold Standard. (2013-05-27 12:48:19) Zonisamide capsules What is this medicine? ZONISAMIDE (zoe NIS a mide) is used to control partial seizures in adults with epilepsy. This medicine may be used for other purposes; ask your health care provider or pharmacist if you have questions. What should I tell my health care provider before I take this medicine? They need to know if you have any of these conditions: -dehydrated -diarrhea -history of metabolic acidosis (too much acid in your blood) -ketogenic diet -kidney disease -liver disease -lung disease -osteoporosis -suicidal thoughts, plans, or attempt; a previous suicide attempt by you or a family member -an unusual or allergic reaction to zonisamide, sulfa drugs, other medicines, foods, dyes, or preservatives -pregnant or trying to get pregnant -breast-feeding How should I use this medicine? Take this medicine by mouth with a glass of water. Follow the directions on the prescription label. Swallow whole. Do not break open the capsule. This medicine may be taken with or without food. Take your doses at regular intervals. Do not take your medicine more often than directed. Do not stop taking this medicine unless instructed by your doctor or health care professional. A special MedGuide will be given to you by the pharmacist with each  prescription and refill. Be sure to read this information carefully each time. Talk to your pediatrician regarding the use of this medicine in children. While this drug may be prescribed for children as young as 43 years of age for selected conditions, precautions do apply. Overdosage: If you think you have taken too much of this medicine contact a poison control center or emergency room at once. NOTE: This medicine is only for you. Do not share this medicine with others. What if I miss a dose? If you miss a dose, take it as soon as you can. If it is almost time for your next dose, take only that dose. Do not take double or extra doses. What may interact with this medicine? -barbiturates like phenobarbital -carbamazepine -phenytoin This list may not describe all possible interactions. Give your health care provider a list of all the medicines, herbs, non-prescription drugs, or dietary supplements you use. Also tell them if you smoke, drink alcohol, or use illegal drugs. Some items may interact with your medicine. What should I watch for while using this medicine? Visit your doctor or health care professional for regular checks on your progress. Wear a medical identification bracelet or chain to say you have epilepsy, and carry a card that lists all your medications. It is important to take this medicine exactly as directed. When first starting treatment, your dose will need to be adjusted slowly. It may take weeks or months before your dose is stable. You should contact your doctor or health care professional if your seizures get worse or if you have any new types of seizures. Do not stop taking except on your doctor's advice. You may develop a severe reaction. Your doctor will tell you how much medicine to take. You may get drowsy, dizzy, or have  blurred vision. Do not drive, use machinery, or do anything that needs mental alertness until you know how this medicine affects you. To reduce dizzy or  fainting spells, do not sit or stand up quickly, especially if you are an older patient. Alcohol can increase drowsiness and dizziness. Avoid alcoholic drinks. Avoid extreme heat. This medicine can cause you to sweat less than normal. Your body temperature could increase to dangerous levels, which may lead to heat stroke. This medicine may increase the chance of developing metabolic acidosis. If left untreated, this can cause kidney stones, bone disease, or slowed growth in children. Symptoms include breathing fast, fatigue, loss of appetite, irregular heartbeat, or loss of consciousness. Call your doctor immediately if you experience any of these side effects. Also, tell your doctor about any surgery you plan on having while taking this medicine since this may increase your risk for metabolic acidosis. This medicines may increase the risk of kidney stones. Drinking 6 to 8 glasses of water a day may help prevent the formation of kidney stones. The use of this medicine may increase the chance of suicidal thoughts or actions. Pay special attention to how you are responding while on this medicine. Any worsening of mood, or thoughts of suicide or dying should be reported to your health care professional right away. Women who become pregnant while using this medicine may enroll in the [place] Pregnancy Registry by calling ([phone]. This registry collects information about the safety of antiepileptic drug use during pregnancy. What side effects may I notice from receiving this medicine? Side effects that you should report to your doctor or health care professional immediately: -allergic reactions like skin rash, itching or hives, swelling of the face, lips, or tongue -decreased sweating or a rise in body temperature, especially in patients under 82 years old -difficulty breathing or tightening of the throat -feeling faint or lightheaded, falls -fever, sore throat, sores in your  mouth, or bruising easily -hallucination, loss of contact with reality -irregular heartbeat -loss of appetite -redness, blistering, peeling or loosening of the skin, including inside the mouth -severe drowsiness, difficulty concentrating, or coordination problems -speech or language problems -sudden back pain, abdominal pain, pain when urinating, bloody or dark urine -suicidal thoughts or depression -unusual changes in behavior or mood -unusually weak or tired -vomiting Side effects that usually do not require medical attention (report to your doctor or health care professional if they continue or are bothersome): -headache -nausea This list may not describe all possible side effects. Call your doctor for medical advice about side effects. You may report side effects to FDA at 1-800-FDA-1088. Where should I keep my medicine? Keep out of reach of children. Store at room temperature between 15 and 30 degrees C (59 and 86 degrees F). Keep in a dry place protected from light. Throw away any unused medicine after the expiration date. NOTE: This sheet is a summary. It may not cover all possible information. If you have questions about this medicine, talk to your doctor, pharmacist, or health care provider.    2016, Elsevier/Gold Standard. (2010-08-11 15:16:42)  Tension Headache A tension headache is pain, pressure, or aching that is felt over the front and sides of your head. These headaches can last from 30 minutes to several days. HOME CARE Managing Pain  Take over-the-counter and prescription medicines only as told by your doctor.  Lie down in a dark, quiet room when you have a headache.  If directed, apply ice to your head  and neck area:  Put ice in a plastic bag.  Place a towel between your skin and the bag.  Leave the ice on for 20 minutes, 2-3 times per day.  Use a heating pad or a hot shower to apply heat to your head and neck area as told by your doctor. Eating and  Drinking  Eat meals on a regular schedule.  Do not drink a lot of alcohol.  Do not use a lot of caffeine, or stop using caffeine. General Instructions  Keep all follow-up visits as told by your doctor. This is important.  Keep a journal to find out if certain things bring on headaches. For example, write down:  What you eat and drink.  How much sleep you get.  Any change to your diet or medicines.  Try getting a massage, or doing other things that help you to relax.  Lessen stress.  Sit up straight. Do not tighten (tense) your muscles.  Do not use tobacco products. This includes cigarettes, chewing tobacco, or e-cigarettes. If you need help quitting, ask your doctor.  Exercise regularly as told by your doctor.  Get enough sleep. This may mean 7-9 hours of sleep. GET HELP IF:  Your symptoms are not helped by medicine.  You have a headache that feels different from your usual headache.  You feel sick to your stomach (nauseous) or you throw up (vomit).  You have a fever. GET HELP RIGHT AWAY IF:  Your headache becomes very bad.  You keep throwing up.  You have a stiff neck.  You have trouble seeing.  You have trouble speaking.  You have pain in your eye or ear.  Your muscles are weak or you lose muscle control.  You lose your balance or you have trouble walking.  You feel like you will pass out (faint) or you pass out.  You have confusion.   This information is not intended to replace advice given to you by your health care provider. Make sure you discuss any questions you have with your health care provider.   Document Released: 01/24/2010 Document Revised: 07/21/2015 Document Reviewed: 02/22/2015 Elsevier Interactive Patient Education [employer].

## 2016-08-22 NOTE — Progress Notes (Signed)
SLEEP MEDICINE CLINIC   Provider:  Larey Seat, M D  Referring Provider: Leanna Battles, MD Primary Care Physician:  Donnajean Lopes, MD  Chief Complaint  Patient presents with  . New Patient (Initial Visit)    headaches 6 x week, 3 x day, has tried topamax but didn't work, on nocturnal oxygen    HPI:  Jill Warren is a 60 y.o. caucasain, married female  And re-seen here as a referral  from Dr. Philip Aspen for a sleep evaluation.    In 2015 , the patient reported insomnia- and  that until 2012 she actually complained about inability to stay awake, she would come home from work and literally had to fight sleepiness at the dinner table. Sometimes she would fall asleep in situations that she didn't intend to. Then a change happened and she states that she became more insomniac by the year 2012. There have been no medical condition or anything else that she could relate to triggering this change may be menopause. What was really am important to mention is that she had and left ankle surgery in 2014 and her orthopedist gave her alprazolam to help her sleep she took this medication for about a year and it worked fine for her but she now is in the process of slowly weaning herself to lower and lower doses. She did she stated that she doesn't feel that she is worried that her mind is racing at night she is completely relaxed she is also not overtly sleeping and daytime even if she didn't get a lot of sleep at night.  Her sleep habits are as follows: The patient relies on an alarm in the morning and rises usually around 8:00. She will have a small breakfast and drinks coffee in the morning. She commutes 20 minutes by car to work. 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 She works 8 hours daily the earliest is usually 10 AM beginning, to 7 PM - but in some days she may be called in for a 1-10 shift. She yawns at work, but doesn't feel  sleepy. She drinks rareley coffee in daytime. She never naps. She goes to bed at 11.30 Pm and takes her benzo at 10 Pm. She will sleep through the night. No coturia, some snoring. Husband has confirmed this, but sleeps in a different bedroom. She wakes up restored in AM, no headches and no dry mouth.   Update from 08/22/2016 Jill Warren is here today, she had a MRI of the brain with and without contrast performed on 06/19/2016 which showed no abnormality this was in relation to a headache complaint as well as a history of melanoma in the 1980s. She does have headaches for about 3 years now and when I first encountered her in 2015 headaches were also part of her complaints. I have evaluated the patient is a home sleep test on 02/20/2015 and at that time her AHI was 1.5 she had prolonged periods of desaturation but there were borderline  enough to justify oxygen supplementation. The headaches did not respond to oxygen. She is now re-referred for a special headache evaluation. Her headaches are described as piercing not throbbing not dull. It feels as if something pierces her right forehead or right high temporal and then the pain can radiate through the cheekbone and around the eye.She feels shoulder and neck tension. Headaches worsen with bending over, with n sneezing .  MRI negative for sinusitis. No acute or remote infarct etc. upper cervical spine nor abnormal marrow signal.    Review of Systems: Out of a complete 14 system review, the patient complains of only the following symptoms, and all other reviewed systems are negative. Tension head and neck pain with a stabbing quality    Epworth score 2  , Fatigue severity score 11 ,  Geriatric depression score 3 points    Social History   Social History  . Marital status: Married    Spouse name: N/A  . Number of children: 0  . 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 Smoking status: Never Smoker  . Smokeless tobacco: Not on file  . Alcohol use 0.0 oz/week     Comment: occ  . Drug use: No  . Sexual activity: Not on file   Other Topics Concern  . Not on file   Social History Narrative   Caffeine 2 cups daily avg.    Family History  Problem Relation Age of Onset  . Stomach cancer Father   . Lymphoma Sister   . Congestive Heart Failure    . Brain cancer Paternal Uncle     Past Medical History:  Diagnosis Date  . Cancer of small intestine ([place]) 2002  . Headache 2013   headaches  . High cholesterol   . Melanoma ([place]) 1998   Skin started  . Other specified disorders of tendon, left ankle and foot    ruptured 2014    Past Surgical History:  Procedure Laterality Date  . ankle tendon Left   . KNEE ARTHROSCOPY Left   . melanoma removal      Current Outpatient Prescriptions  Medication Sig Dispense Refill  . bifidobacterium infantis (ALIGN) capsule Take 1 capsule by mouth 3 (three) times a week.  30 capsule 0  . celecoxib (CELEBREX) 200 MG capsule Take 200 mg by mouth daily.    . cholecalciferol (VITAMIN D) 1000 UNITS tablet Take 1,000 Units by mouth daily.    Marland Kitchen lisinopril (PRINIVIL,ZESTRIL) 5 MG tablet Take 5 mg by mouth daily.    Marland Kitchen lovastatin (MEVACOR) 40 MG tablet Take 40 mg by mouth 3 (three) times a week.     Marland Kitchen PREMARIN vaginal cream TAKE 1 APPLICATOR INTRAVAGINALLY TWICE WEEKLY  3  . triazolam (HALCION) 0.25 MG tablet Take 0.25 mg by mouth at bedtime as needed for sleep. 1/2 tab daily     No current facility-administered medications for this visit.     Allergies as of 08/22/2016  . (No Known Allergies)    Vitals: BP (!) 144/82   Pulse 76   Resp 20   Ht 5\' 3"  (1.6 m)   Wt 197 lb (89.4 kg)   BMI 34.90 kg/m  Last Weight:  Wt Readings from Last 1 Encounters:  08/22/16 197 lb (89.4 kg)       Last Height:   Ht Readings from Last 1 Encounters:  08/22/16 5\' 3"  (1.6 m)    Physical exam:  General: The patient is  awake, alert and appears not in acute distress. The patient is well groomed. Head: Normocephalic, atraumatic. Neck is supple. Mallampati 2 , tonils present.  neck circumference: 13.25 . Nasal airflow  unrestricted , TMJ is  evident . Retrognathia is  seen.  Cardiovascular:  Regular rate and rhythm, without  murmurs or carotid bruit, and without distended neck veins.  Respiratory: Lungs are clear to auscultation.  Neurologic exam : The patient is awake and alert, oriented to place and time.   Memory subjective  described as intact. There is a normal attention span & concentration ability. Speech is fluent without  dysarthria, dysphonia or aphasia. Mood and affect are appropriate. Cranial nerves: Pupils are equal and briskly reactive to light. Funduscopic exam without  evidence of pallor or edema. Extraocular movements in vertical and horizontal planes intact and without nystagmus. Visual fields by finger perimetry are intact. Hearing to finger rub intact.   Facial sensation intact to fine touch. Facial motor strength is symmetric and tongue and uvula move midline.  Motor exam:   Normal tone ,muscle bulk and symmetric , strength in all extremities. ROM restriction in ankle and knee form osteoarthritis.   Sensory:  Fine touch, pinprick and vibration were tested in all extremities.  Proprioception is  normal.  Coordination: Rapid alternating movements in the fingers/hands is normal. Finger-to-nose maneuver normal without evidence of ataxia, dysmetria or tremor.  Gait and station: Patient walks without assistive device and is able unassisted to climb up to the exam table. Strength within normal limits. Stance is stable and normal.    Deep tendon reflexes: in the  upper and lower extremities are symmetric and intact. Babinski maneuver response is upgoing- on the surgically altered  Left side.    Assessment:  After physical and neurologic examination, review of laboratory studies, imaging,  neurophysiology testing and pre-existing records, assessment is :  1) the patient has tried topiramate instead of amitriptyline, neither of these medications seems to have improved her headaches, she reports that she sleeps now longer than 2 years ago but still doesn't get refreshing sleep. The sleep pattern changes towards the holidays with an increased stress level at work as well. She reports that her headaches are increased when she performs a Valsalva maneuver. At times she has had low-grade fevers fatigue muscle achiness and neck stiffness.  I will try Zonisamide as a sleep promoting , headache treatment. 5 mg Flexaril at night po. For emergency Headache treatment at work, I can recommend  Keep hydrating.      Asencion Partridge Syd Manges MD  08/22/2016

## 2016-11-23 ENCOUNTER — Ambulatory Visit: Payer: 59 | Admitting: Adult Health

## 2017-01-30 ENCOUNTER — Encounter (INDEPENDENT_AMBULATORY_CARE_PROVIDER_SITE_OTHER): Payer: Self-pay

## 2017-01-30 ENCOUNTER — Encounter: Payer: Self-pay | Admitting: Adult Health

## 2017-01-30 ENCOUNTER — Ambulatory Visit (INDEPENDENT_AMBULATORY_CARE_PROVIDER_SITE_OTHER): Payer: 59 | Admitting: Adult Health

## 2017-01-30 VITALS — BP 138/77 | HR 60 | Ht 63.0 in | Wt 200.0 lb

## 2017-01-30 DIAGNOSIS — R51 Headache: Secondary | ICD-10-CM | POA: Diagnosis not present

## 2017-01-30 DIAGNOSIS — R519 Headache, unspecified: Secondary | ICD-10-CM

## 2017-01-30 MED ORDER — INDOMETHACIN 25 MG PO CAPS: 25.0000 mg | ORAL_CAPSULE | Freq: Two times a day (BID) | ORAL | 5 refills | Status: DC

## 2017-01-30 NOTE — Progress Notes (Signed)
PATIENT: KHAMORA KARAN DOB: [birth year]  REASON FOR VISIT: follow up- headache HISTORY FROM: patient  HISTORY OF PRESENT ILLNESS: Ms. Costello  is a 61 year old with a history of daily headache. She returns today for follow-up. She reports that she has a mild headache throughout the day. However at least 3 times a day  she will have stabbing pain in the right frontal region. She reports these events can last up to 30 minutes.. She denies any nasal congestion or lacrimation. She does have mild phonophobia but denies phonophobia. She has tried Topamax, amitriptyline, Flexeril, gabapentin, Zonegran. She states even when she cough will cause her head hurt. She returns today for an evaluation  HISTORY 08/22/16: OLUWADARA GORMAN is a 61 y.o. caucasain, married female  And re-seen here as a referral  from Dr. Philip Aspen for a sleep evaluation.    In 2015 , the patient reported insomnia- and  that until 2012 she actually complained about inability to stay awake, she would come home from work and literally had to fight sleepiness at the dinner table. Sometimes she would fall asleep in situations that she didn't intend to. Then a change happened and she states that she became more insomniac by the year 2012. There have been no medical condition or anything else that she could relate to triggering this change may be menopause. What was really am important to mention is that she had and left ankle surgery in 2014 and her orthopedist gave her alprazolam to help her sleep she took this medication for about a year and it worked fine for her but she now is in the process of slowly weaning herself to lower and lower doses. She did she stated that she doesn't feel that she is worried that her mind is racing at night she is completely relaxed she is also not overtly sleeping and daytime even if she didn't get a lot of sleep at night.  Her sleep habits are as follows: The patient relies on an alarm in the  morning and rises usually around 8:00. She will have a small breakfast and drinks coffee in the morning. She commutes 20 minutes by car to work. 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 She works 8 hours daily the earliest is usually 10 AM beginning, to 7 PM - but in some days she may be called in for a 1-10 shift. She yawns at work, but doesn't feel sleepy. She drinks rareley coffee in daytime. She never naps. She goes to bed at 11.30 Pm and takes her benzo at 10 Pm. She will sleep through the night. No coturia, some snoring. Husband has confirmed this, but sleeps in a different bedroom. She wakes up restored in AM, no headches and no dry mouth.   Update from 08/22/2016 Mrs. Willye Javier is here today, she had a MRI of the brain with and without contrast performed on 06/19/2016 which showed no abnormality this was in relation to a headache complaint as well as a history of melanoma in the 1980s. She does have headaches for about 3 years now and when I first encountered her in 2015 headaches were also part of her complaints. I have evaluated the patient is a home sleep test on 02/20/2015 and at that time her AHI was 1.5 she had prolonged periods of desaturation but there were borderline  enough to justify oxygen supplementation. The headaches did not respond to  oxygen. She is now re-referred for a special headache evaluation. Her headaches are described as piercing not throbbing not dull. It feels as if something pierces her right forehead or right high temporal and then the pain can radiate through the cheekbone and around the eye.She feels shoulder and neck tension. Headaches worsen with bending over, with n sneezing . MRI negative for sinusitis. No acute or remote infarct etc. upper cervical spine nor abnormal marrow signal.     REVIEW OF SYSTEMS: Out of a complete 14 system review of symptoms, the patient complains only of the  following symptoms, and all other reviewed systems are negative.  Headache, joint pain, back pain  ALLERGIES: No Known Allergies  HOME MEDICATIONS: Outpatient Medications Prior to Visit  Medication Sig Dispense Refill  . bifidobacterium infantis (ALIGN) capsule Take 1 capsule by mouth 3 (three) times a week.  30 capsule 0  . cholecalciferol (VITAMIN D) 1000 UNITS tablet Take 1,000 Units by mouth daily.    Marland Kitchen lisinopril (PRINIVIL,ZESTRIL) 5 MG tablet Take 5 mg by mouth daily.    Marland Kitchen lovastatin (MEVACOR) 40 MG tablet Take 40 mg by mouth 3 (three) times a week.     Marland Kitchen PREMARIN vaginal cream TAKE 1 APPLICATOR INTRAVAGINALLY TWICE WEEKLY  3  . triazolam (HALCION) 0.25 MG tablet Take 0.25 mg by mouth at bedtime as needed for sleep. 1/2 tab daily    . zonisamide (ZONEGRAN) 25 MG capsule Take 1 capsule (25 mg total) by mouth daily. Take 1 capsule by mouth at night and one at 10 am. 60 capsule 2  . celecoxib (CELEBREX) 200 MG capsule Take 200 mg by mouth daily.    . cyclobenzaprine (FLEXERIL) 5 MG tablet Prn HS for neck spasms. (Patient not taking: Reported on 01/30/2017) 30 tablet 3   No facility-administered medications prior to visit.     PAST MEDICAL HISTORY: Past Medical History:  Diagnosis Date  . Cancer of small intestine ([place]) 2002  . Headache 2013   headaches  . High cholesterol   . Melanoma ([place]) 1998   Skin started  . Other specified disorders of tendon, left ankle and foot    ruptured 2014    PAST SURGICAL HISTORY: Past Surgical History:  Procedure Laterality Date  . ankle tendon Left   . KNEE ARTHROSCOPY Left   . melanoma removal      FAMILY HISTORY: Family History  Problem Relation Age of Onset  . Stomach cancer Father   . Lymphoma Sister   . Congestive Heart Failure    . Brain cancer Paternal Uncle     SOCIAL HISTORY: Social History   Social History  . Marital status: Married    Spouse name: N/A  . Number of children: 0  . 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 Smoking status: Never Smoker  . Smokeless tobacco: Never Used  . Alcohol use 0.0 oz/week     Comment: occ  . Drug use: No  . Sexual activity: Not on file   Other Topics Concern  . Not on file   Social History Narrative   Caffeine 2 cups daily avg.      PHYSICAL EXAM  Vitals:   01/30/17 1358  BP: 138/77  Pulse: 60  Weight: 200 lb (90.7 kg)  Height: 5\' 3"  (1.6 m)   Body mass index is 35.43 kg/m.  Generalized: Well developed, in no acute distress  Neurological examination  Mentation: Alert oriented to time, place, history taking. Follows all commands speech and language fluent Cranial nerve II-XII: Pupils were equal round reactive to light. Extraocular movements were full, visual field were full on confrontational test. Facial sensation and strength were normal. Uvula tongue midline. Head turning and shoulder shrug  were normal and symmetric. Motor: The motor testing reveals 5 over 5 strength of all 4 extremities. Good symmetric motor tone is noted throughout.  Sensory: Sensory testing is intact to soft touch on all 4 extremities. No evidence of extinction is noted.  Coordination: Cerebellar testing reveals good finger-nose-finger and heel-to-shin bilaterally.  Gait and station: Patient has a slight limp when she ambulates due to arthritis. Tandem gait not attempted. Romberg is negative.  Reflexes: Deep tendon reflexes are symmetric and normal bilaterally.   DIAGNOSTIC DATA (LABS, IMAGING, TESTING) - I reviewed patient records, labs, notes, testing and imaging myself where available.     ASSESSMENT AND PLAN 61 y.o. year old female  has a past medical history of Cancer of small intestine ([place]) (2002); Headache (2013); High cholesterol; Melanoma ([place]) (1998); and Other specified disorders of tendon, left ankle and foot. here with:  1. Headache  The patient has an atypical presentation  to her headaches. 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 I will start the patient on indomethacin 25 mg twice a day. I have reviewed side effects with the patient. Advised that if this is not beneficial for her headaches we can try increasing to 25 mg 3 times a day. If indomethacin is not beneficial we can try verapamil. The patient is amenable to this plan. She will follow-up in 3 months or sooner if needed.  I consulted with Dr. Jaynee Eagles regarding plan of care.  I spent 15 minutes with the patient 50% of this time was spent counseling the patient on indomethacin.     Ward Givens, MSN, NP-C 01/30/2017, 2:09 PM [facility] [address] [place], [place] [address] [phone]

## 2017-01-30 NOTE — Patient Instructions (Signed)
Try Indomethacin 25 mg twice a day. Take with meals.  If your symptoms worsen or you develop new symptoms please let us know.    Indomethacin capsules What is this medicine? INDOMETHACIN (in doe METH a sin) is a non-steroidal anti-inflammatory drug (NSAID). It is used to reduce swelling and to treat pain. It may be used for painful joint and muscular problems such as arthritis, tendinitis, bursitis, and gout. This medicine may be used for other purposes; ask your health care provider or pharmacist if you have questions. COMMON BRAND NAME(S): Indocin, TIVORBEX What should I tell my health care provider before I take this medicine? They need to know if you have any of these conditions: -asthma, especially aspirin sensitive asthma -coronary artery bypass graft (CABG) surgery within the past 2 weeks -depression -drink more than 3 alcohol containing drinks a day -heart disease or circulation problems like heart failure or leg edema (fluid retention) -high blood pressure -kidney disease -liver disease -Parkinson's disease -seizures -stomach bleeding or ulcers -an unusual or allergic reaction to indomethacin, aspirin, other NSAIDs, other medicines, foods, dyes, or preservatives -pregnant or trying to get pregnant -breast-feeding How should I use this medicine? Take this medicine by mouth with food and with a full glass of water. Follow the directions on the prescription label. Take your medicine at regular intervals. Do not take your medicine more often than directed. Long-term, continuous use may increase the risk of heart attack or stroke. A special MedGuide will be given to you by the pharmacist with each prescription and refill. Be sure to read this information carefully each time. Talk to your pediatrician regarding the use of this medicine in children. Special care may be needed. While this drug may be prescribed for children as young as 15 years for selected conditions, precautions do  apply. Elderly patients over 91 years old may have a stronger reaction and need a smaller dose. Overdosage: If you think you have taken too much of this medicine contact a poison control center or emergency room at once. NOTE: This medicine is only for you. Do not share this medicine with others. What if I miss a dose? If you miss a dose, take it as soon as you can. If it is almost time for your next dose, take only that dose. Do not take double or extra doses. What may interact with this medicine? Do not take this medicine with any of the following medications: -cidofovir -diflunisal -ketorolac -methotrexate -pemetrexed -triamterene This medicine may also interact with the following medications: -alcohol -antacids -aspirin and aspirin-like medicines -cyclosporine -digoxin -diuretics -lithium -medicines for diabetes -medicines for high blood pressure -medicines that affect platelets -medicines that treat or prevent blood clots like warfarin -NSAIDs, medicines for pain and inflammation, like ibuprofen or naproxen -probenecid -steroid medicines like prednisone or cortisone This list may not describe all possible interactions. Give your health care provider a list of all the medicines, herbs, non-prescription drugs, or dietary supplements you use. Also tell them if you smoke, drink alcohol, or use illegal drugs. Some items may interact with your medicine. What should I watch for while using this medicine? Tell your doctor or health care professional if your pain does not get better. Talk to your doctor before taking another medicine for pain. Do not treat yourself. This medicine does not prevent heart attack or stroke. In fact, this medicine may increase the chance of a heart attack or stroke. The chance may increase with longer use of this medicine and in  people who have heart disease. If you take aspirin to prevent heart attack or stroke, talk with your doctor or health care  professional. Do not take medicines such as ibuprofen and naproxen with this medicine. Side effects such as stomach upset, nausea, or ulcers may be more likely to occur. Many medicines available without a prescription should not be taken with this medicine. This medicine can cause ulcers and bleeding in the stomach and intestines at any time during treatment. Do not smoke cigarettes or drink alcohol. These increase irritation to your stomach and can make it more susceptible to damage from this medicine. Ulcers and bleeding can happen without warning symptoms and can cause death. You may get drowsy or dizzy. Do not drive, use machinery, or do anything that needs mental alertness until you know how this medicine affects you. Do not stand or sit up quickly, especially if you are an older patient. This reduces the risk of dizzy or fainting spells. This medicine can cause you to bleed more easily. Try to avoid damage to your teeth and gums when you brush or floss your teeth. What side effects may I notice from receiving this medicine? Side effects that you should report to your doctor or health care professional as soon as possible: -allergic reactions like skin rash, itching or hives, swelling of the face, lips, or tongue -difficulty breathing or wheezing -nausea, vomiting -signs and symptoms of bleeding such as bloody or black, tarry stools; red or dark-brown urine; spitting up blood or brown material that looks like coffee grounds; red spots on the skin; unusual bruising or bleeding from the eye, gums, or nose -signs and symptoms of a blood clot such as changes in vision; chest pain; severe, sudden headache; trouble speaking; sudden numbness or weakness of the face, arm, or leg; trouble walking -unexplained weight gain or swelling -unusually weak or tired -yellowing of eyes or skin Side effects that usually do not require medical attention (report to your doctor or health care professional if they  continue or are bothersome): -diarrhea -dizziness -headache -heartburn This list may not describe all possible side effects. Call your doctor for medical advice about side effects. You may report side effects to FDA at 1-800-FDA-1088. Where should I keep my medicine? Keep out of the reach of children. Store at room temperature between 15 and 30 degrees C (59 and 86 degrees F). Keep container tightly closed. Throw away any unused medicine after the expiration date. NOTE: This sheet is a summary. It may not cover all possible information. If you have questions about this medicine, talk to your doctor, pharmacist, or health care provider.  2018 Elsevier/Gold Standard (2013-03-18 15:28:44)

## 2017-01-31 ENCOUNTER — Other Ambulatory Visit: Payer: Self-pay

## 2017-02-01 ENCOUNTER — Other Ambulatory Visit: Payer: Self-pay

## 2017-02-01 DIAGNOSIS — R911 Solitary pulmonary nodule: Secondary | ICD-10-CM

## 2017-02-02 DIAGNOSIS — J019 Acute sinusitis, unspecified: Secondary | ICD-10-CM | POA: Insufficient documentation

## 2017-02-08 ENCOUNTER — Other Ambulatory Visit: Payer: Self-pay

## 2017-02-08 MED ORDER — ZONISAMIDE 25 MG PO CAPS: ORAL_CAPSULE | ORAL | 1 refills | Status: DC

## 2017-03-06 ENCOUNTER — Inpatient Hospital Stay: Admission: RE | Admit: 2017-03-06 | Payer: Managed Care, Other (non HMO) | Source: Ambulatory Visit

## 2017-03-21 ENCOUNTER — Telehealth: Payer: Self-pay | Admitting: Adult Health

## 2017-03-21 MED ORDER — VERAPAMIL HCL 40 MG PO TABS: 40.0000 mg | ORAL_TABLET | Freq: Two times a day (BID) | ORAL | 5 refills | Status: DC

## 2017-03-21 NOTE — Telephone Encounter (Signed)
We will start low dose veramapil 40 mg BID. Please review this SE of this medication with the patient. It is a blood pressure medication so she should keep a check on her BP. Stop Indomethacin   Verapamil tablets What is this medicine? VERAPAMIL (ver AP a mil) is a calcium-channel blocker. It affects the amount of calcium found in your heart and muscle cells. This relaxes your blood vessels, which can reduce the amount of work the heart has to do. This medicine is used to treat chest pain caused by angina, high blood pressure, and controls heart rate in certain conditions. This medicine may be used for other purposes; ask your health care provider or pharmacist if you have questions. COMMON BRAND NAME(S): Calan What should I tell my health care provider before I take this medicine? They need to know if you have any of these conditions: -heart or blood vessel disease -heart rhythm disturbances such as sick sinus syndrome, ventricular arrhythmias, Wolff-Parkinson-White syndrome, or Lown-Ganong-Levine syndrome -liver or kidney disease -low blood pressure -an unusual or allergic reaction to verapamil, other medicines, foods, dyes, or preservatives -pregnant or trying to get pregnant -breast-feeding How should I use this medicine? Take this medicine by mouth with a glass of water. Follow the directions on the prescription label. This medicine can be taken with or without food. Take your doses at regular intervals. Do not take your medicine more often than directed. Talk to your pediatrician regarding the use of this medicine in children. Special care may be needed. Overdosage: If you think you have taken too much of this medicine contact a poison control center or emergency room at once. NOTE: This medicine is only for you. Do not share this medicine with others. What if I miss a dose? If you miss a dose, take it as soon as you can. If it is almost time for your next dose, take only that dose. Do  not take double or extra doses. What may interact with this medicine? Do not take this medicine with any of the following: -cisapride -disopyramide -dofetilide -grapefruit juice -hawthorn -pimozide -red yeast rice This medicine may also interact with the following medications: -barbiturates such as phenobarbital -cimetidine -cyclosporine -lithium -local anesthetics or general anesthetics -medicines for heart rhythm problems like amiodarone, digoxin, flecainide, procainamide, quinidine -medicines for high blood pressure or heart problems -medicines for seizures like carbamazepine and phenytoin -rifampin, rifabutin or rifapentine -theophylline or aminophylline This list may not describe all possible interactions. Give your health care provider a list of all the medicines, herbs, non-prescription drugs, or dietary supplements you use. Also tell them if you smoke, drink alcohol, or use illegal drugs. Some items may interact with your medicine. What should I watch for while using this medicine? Check your blood pressure and pulse rate regularly. Ask your doctor or health care professional what your blood pressure and pulse rate should be and when you should contact him or her. Do not suddenly stop taking this medicine. Ask your doctor or health care professional how to gradually reduce the dose. You may get drowsy or dizzy. Do not drive, use machinery, or do anything that needs mental alertness until you know how this medicine affects you. Do not stand or sit up quickly, especially if you are an older patient. This reduces the risk of dizzy or fainting spells. Alcohol may interfere with the effect of this medicine. Avoid alcoholic drinks. What side effects may I notice from receiving this medicine? Side effects that you should  report to your doctor or health care professional as soon as possible: -difficulty breathing -dizziness or light headedness -fainting -fast heartbeat, palpitations,  irregular heartbeat, or chest pain -skin rash -slow heartbeat -swelling of the legs or ankles Side effects that usually do not require medical attention (report to your doctor or health care professional if they continue or are bothersome): -constipation -facial flushing -headache -nausea, vomiting -sexual dysfunction -weakness or tiredness

## 2017-03-21 NOTE — Telephone Encounter (Signed)
Pt called said indomethacin (INDOCIN) 25 MG capsule was great but after the 3rd day she began having upset bowels. She did take it for 10 days. She was taking 2 tabs, she is wondering if maybe 1 tab/day would not cause GI upset. Please call

## 2017-03-21 NOTE — Telephone Encounter (Signed)
Spoke to pt.  She stated that she took indocin 25mg  po bid and was doing great, headaches gone, no pain anywhere.  She then started with diarrhea, stomach pain.  She stopped med and SE resolved.  She tried taking one tablet and did not work as well.   She feels like she did eat when she took this.  Change to something else? Verapamil per last note? If so, fill small amount to see if tolerates.

## 2017-03-21 NOTE — Telephone Encounter (Signed)
LMVM for pt to return call starting a new medication called verapamil, low dose 40mg  po bid.

## 2017-03-22 ENCOUNTER — Telehealth: Payer: Self-pay | Admitting: Pulmonary Disease

## 2017-03-22 NOTE — Progress Notes (Signed)
Personally  participated in, made any corrections needed, and agree with history, physical, neuro exam,assessment and plan as stated above.    Antonia Ahern, MD [facility] 

## 2017-03-22 NOTE — Telephone Encounter (Signed)
Patient called office returning RN's call.  Please call after 3:00pm.

## 2017-03-22 NOTE — Telephone Encounter (Signed)
Spoke to pt and relayed the plan of care relating to stopping indocin.  Start verapamil low dose 40mg  po bid.  Went over [employer] Monitor Bp as can lower Bp.  She stated she is not taking lisonpril now and taking a potassium ? Bp med.  I relayed to speak with pcp relating to taking both.  She was not familiar with name of the new bp from pcp. She verbalized understanding.

## 2017-03-22 NOTE — Telephone Encounter (Signed)
Pt is requesting that the order for the CT scan be faxed to [facility] Fax # [phone].  [facility] are yall able to print off the order and fax it?  thanks

## 2017-03-23 NOTE — Telephone Encounter (Signed)
Jill Warren has faxed this order to [facility]. Confirmation recieved

## 2017-03-29 ENCOUNTER — Encounter: Payer: Self-pay | Admitting: Adult Health

## 2017-04-02 ENCOUNTER — Telehealth: Payer: Self-pay | Admitting: Pulmonary Disease

## 2017-04-02 NOTE — Telephone Encounter (Signed)
Spoke with Jill Warren at [facility]. Needing additional information faxed - most recent OV notes and CT chest printed and faxed to [phone] [facility]. Nothing further needed.

## 2017-04-12 NOTE — Telephone Encounter (Signed)
Pt calling stating that her PCP(Dr Sharlett Iles) advised her not to take the last 2 medications prescribed the verapamil or the indomethacin Pt said that her head aches she feels may be as a result of neck pain. Pt said her pain is at the top of her head. She would like to know if a MRI is being considered for her neck.  Pt asking to be called on her mobile

## 2017-04-12 NOTE — Telephone Encounter (Signed)
Spoke to pt and she is not taking the indocin due to stomach issues, and the verapamil, her pcp told her not to take it, her Bp is already low and she was concerned that taking this would cause her more issues with this.  She states her head pains have not moved from her temple/eye L side area to the top of her head, started about 4 days ago causing her to cry from the pain.  This is new, (has episodes twice daily lasting 20-30 mins which the pain lingers.  Also c/o of neck shoulder pain for the last 2wks.  Pt asking to have MRI neck to check this out.  I offered her to come in Monday 04-16-17 at 1100 opening but she cannot due to work.  No other availability soon.  Her 6-20 appt was cancelled as MM/NP change in schedule.  Would you consider MRI?  Another med for her head pains?

## 2017-04-13 ENCOUNTER — Telehealth: Payer: Self-pay | Admitting: Neurology

## 2017-04-13 ENCOUNTER — Ambulatory Visit: Payer: Self-pay | Admitting: Orthopedic Surgery

## 2017-04-13 MED ORDER — INDOMETHACIN 25 MG SUPPOSITORY: 25.0000 mg | Freq: Two times a day (BID) | RECTAL | 0 refills | Status: DC

## 2017-04-13 NOTE — Telephone Encounter (Signed)
I investigate if there is a stomach friendly form of Indocin.  She should not have to take it daily for a period over 12 days in duration and only with a buffer of food, milk or banana.  PDS_ addendum;  I just found a rectal suppository generic- if she is willing to take that , I can call it in on Monday. Or can you do it today ? Her shoulder pain may have surfaced because indocin does treat joint inflammation.   Larey Seat, MD

## 2017-04-13 NOTE — Telephone Encounter (Signed)
Dr. Brett Fairy,   What are your recommendations? Indocin worked for her headaches but it caused stomach upset. C/o of shoulder pain is new.

## 2017-04-16 ENCOUNTER — Encounter (HOSPITAL_COMMUNITY): Payer: Self-pay | Admitting: *Deleted

## 2017-04-16 NOTE — Telephone Encounter (Signed)
I called the patient. Left VM to call office. Will try again later.

## 2017-04-16 NOTE — Telephone Encounter (Signed)
See Other phone message per MM/NP.

## 2017-04-17 NOTE — Telephone Encounter (Signed)
I called the patient. I left a voicemail for her to call our office if she still has concerns.

## 2017-04-18 ENCOUNTER — Other Ambulatory Visit: Payer: Managed Care, Other (non HMO)

## 2017-04-18 ENCOUNTER — Other Ambulatory Visit (HOSPITAL_COMMUNITY): Payer: Self-pay | Admitting: *Deleted

## 2017-04-18 NOTE — Patient Instructions (Addendum)
Jill Warren  04/18/2017   Your procedure is scheduled on: 04-25-17  Report to [facility] Main  Entrance  Report to admitting at 630 AM  Call this number if you have problems the morning of surgery [phone]   Remember: ONLY 1 PERSON MAY GO WITH YOU TO SHORT STAY TO GET  READY MORNING OF YOUR SURGERY.  Do not eat food or drink liquids :After Midnight.     Take these medicines the morning of surgery with A SIP OF WATER: eye drop if needed                                You may not have any metal on your body including hair pins and              piercings  Do not wear jewelry, make-up, lotions, powders or perfumes, deodorant             Do not wear nail polish.  Do not shave  48 hours prior to surgery.              Men may shave face and neck.   Do not bring valuables to the hospital. [place].  Contacts, dentures or bridgework may not be worn into surgery.  Leave suitcase in the car. After surgery it may be brought to your room.                  Please read over the following fact sheets you were given: _____________________________________________________________________             [facility] - Preparing for Surgery Before surgery, you can play an important role.  Because skin is not sterile, your skin needs to be as free of germs as possible.  You can reduce the number of germs on your skin by washing with CHG (chlorahexidine gluconate) soap before surgery.  CHG is an antiseptic cleaner which kills germs and bonds with the skin to continue killing germs even after washing. Please DO NOT use if you have an allergy to CHG or antibacterial soaps.  If your skin becomes reddened/irritated stop using the CHG and inform your nurse when you arrive at Short Stay. Do not shave (including legs and underarms) for at least 48 hours prior to the first CHG shower.  You may shave your face/neck. Please follow  these instructions carefully:  1.  Shower with CHG Soap the night before surgery and the  morning of Surgery.  2.  If you choose to wash your hair, wash your hair first as usual with your  normal  shampoo.  3.  After you shampoo, rinse your hair and body thoroughly to remove the  shampoo.                           4.  Use CHG as you would any other liquid soap.  You can apply chg directly  to the skin and wash                       Gently with a scrungie or clean washcloth.  5.  Apply the CHG Soap to your body ONLY  FROM THE NECK DOWN.   Do not use on face/ open                           Wound or open sores. Avoid contact with eyes, ears mouth and genitals (private parts).                       Wash face,  Genitals (private parts) with your normal soap.             6.  Wash thoroughly, paying special attention to the area where your surgery  will be performed.  7.  Thoroughly rinse your body with warm water from the neck down.  8.  DO NOT shower/wash with your normal soap after using and rinsing off  the CHG Soap.                9.  Pat yourself dry with a clean towel.            10.  Wear clean pajamas.            11.  Place clean sheets on your bed the night of your first shower and do not  sleep with pets. Day of Surgery : Do not apply any lotions/deodorants the morning of surgery.  Please wear clean clothes to the hospital/surgery center.  FAILURE TO FOLLOW THESE INSTRUCTIONS MAY RESULT IN THE CANCELLATION OF YOUR SURGERY PATIENT SIGNATURE_________________________________  NURSE SIGNATURE__________________________________  ________________________________________________________________________   Adam Phenix  An incentive spirometer is a tool that can help keep your lungs clear and active. This tool measures how well you are filling your lungs with each breath. Taking long deep breaths may help reverse or decrease the chance of developing breathing (pulmonary) problems  (especially infection) following:  A long period of time when you are unable to move or be active. BEFORE THE PROCEDURE   If the spirometer includes an indicator to show your best effort, your nurse or respiratory therapist will set it to a desired goal.  If possible, sit up straight or lean slightly forward. Try not to slouch.  Hold the incentive spirometer in an upright position. INSTRUCTIONS FOR USE  1. Sit on the edge of your bed if possible, or sit up as far as you can in bed or on a chair. 2. Hold the incentive spirometer in an upright position. 3. Breathe out normally. 4. Place the mouthpiece in your mouth and seal your lips tightly around it. 5. Breathe in slowly and as deeply as possible, raising the piston or the ball toward the top of the column. 6. Hold your breath for 3-5 seconds or for as long as possible. Allow the piston or ball to fall to the bottom of the column. 7. Remove the mouthpiece from your mouth and breathe out normally. 8. Rest for a few seconds and repeat Steps 1 through 7 at least 10 times every 1-2 hours when you are awake. Take your time and take a few normal breaths between deep breaths. 9. The spirometer may include an indicator to show your best effort. Use the indicator as a goal to work toward during each repetition. 10. After each set of 10 deep breaths, practice coughing to be sure your lungs are clear. If you have an incision (the cut made at the time of surgery), support your incision when coughing by placing a pillow or rolled up towels firmly against it. Once you  are able to get out of bed, walk around indoors and cough well. You may stop using the incentive spirometer when instructed by your caregiver.  RISKS AND COMPLICATIONS  Take your time so you do not get dizzy or light-headed.  If you are in pain, you may need to take or ask for pain medication before doing incentive spirometry. It is harder to take a deep breath if you are having  pain. AFTER USE  Rest and breathe slowly and easily.  It can be helpful to keep track of a log of your progress. Your caregiver can provide you with a simple table to help with this. If you are using the spirometer at home, follow these instructions: [place] IF:   You are having difficultly using the spirometer.  You have trouble using the spirometer as often as instructed.  Your pain medication is not giving enough relief while using the spirometer.  You develop fever of 100.5 F (38.1 C) or higher. SEEK IMMEDIATE MEDICAL CARE IF:   You cough up bloody sputum that had not been present before.  You develop fever of 102 F (38.9 C) or greater.  You develop worsening pain at or near the incision site. MAKE SURE YOU:   Understand these instructions.  Will watch your condition.  Will get help right away if you are not doing well or get worse. Document Released: 03/12/2007 Document Revised: 01/22/2012 Document Reviewed: 05/13/2007 Exit[employer] Patient Information 2014 [employer], [place].   ________________________________________________________________________  WHAT IS A BLOOD TRANSFUSION? Blood Transfusion Information  A transfusion is the replacement of blood or some of its parts. Blood is made up of multiple cells which provide different functions.  Red blood cells carry oxygen and are used for blood loss replacement.  White blood cells fight against infection.  Platelets control bleeding.  Plasma helps clot blood.  Other blood products are available for specialized needs, such as hemophilia or other clotting disorders. BEFORE THE TRANSFUSION  Who gives blood for transfusions?   Healthy volunteers who are fully evaluated to make sure their blood is safe. This is blood bank blood. Transfusion therapy is the safest it has ever been in the practice of medicine. Before blood is taken from a donor, a complete history is taken to make sure that person has no history  of diseases nor engages in risky social behavior (examples are intravenous drug use or sexual activity with multiple partners). The donor's travel history is screened to minimize risk of transmitting infections, such as malaria. The donated blood is tested for signs of infectious diseases, such as HIV and hepatitis. The blood is then tested to be sure it is compatible with you in order to minimize the chance of a transfusion reaction. If you or a relative donates blood, this is often done in anticipation of surgery and is not appropriate for emergency situations. It takes many days to process the donated blood. RISKS AND COMPLICATIONS Although transfusion therapy is very safe and saves many lives, the main dangers of transfusion include:   Getting an infectious disease.  Developing a transfusion reaction. This is an allergic reaction to something in the blood you were given. Every precaution is taken to prevent this. The decision to have a blood transfusion has been considered carefully by your caregiver before blood is given. Blood is not given unless the benefits outweigh the risks. AFTER THE TRANSFUSION  Right after receiving a blood transfusion, you will usually feel much better and more energetic. This is  especially true if your red blood cells have gotten low (anemic). The transfusion raises the level of the red blood cells which carry oxygen, and this usually causes an energy increase.  The nurse administering the transfusion will monitor you carefully for complications. HOME CARE INSTRUCTIONS  No special instructions are needed after a transfusion. You may find your energy is better. Speak with your caregiver about any limitations on activity for underlying diseases you may have. SEEK MEDICAL CARE IF:   Your condition is not improving after your transfusion.  You develop redness or irritation at the intravenous (IV) site. SEEK IMMEDIATE MEDICAL CARE IF:  Any of the following symptoms  occur over the next 12 hours:  Shaking chills.  You have a temperature by mouth above 102 F (38.9 C), not controlled by medicine.  Chest, back, or muscle pain.  People around you feel you are not acting correctly or are confused.  Shortness of breath or difficulty breathing.  Dizziness and fainting.  You get a rash or develop hives.  You have a decrease in urine output.  Your urine turns a dark color or changes to pink, red, or brown. Any of the following symptoms occur over the next 10 days:  You have a temperature by mouth above 102 F (38.9 C), not controlled by medicine.  Shortness of breath.  Weakness after normal activity.  The white part of the eye turns yellow (jaundice).  You have a decrease in the amount of urine or are urinating less often.  Your urine turns a dark color or changes to pink, red, or brown. Document Released: 10/27/2000 Document Revised: 01/22/2012 Document Reviewed: 06/15/2008 [facility] Patient Information 2014 [place], [place].  _______________________________________________________________________

## 2017-04-18 NOTE — Telephone Encounter (Signed)
"  Jill Warren, thank you for calling back". Pt said she is having hip replacement next week and will have to put HA's on hold for now.

## 2017-04-18 NOTE — Telephone Encounter (Signed)
Noted  

## 2017-04-18 NOTE — Progress Notes (Signed)
lov dr Bevelyn Buckles 04-17-17 on chart Chest ct 04-11-17 [facility] on chart

## 2017-04-18 NOTE — Telephone Encounter (Signed)
I would recommend indocin in suppository form.this avoids the stomach upset.  CD

## 2017-04-19 ENCOUNTER — Ambulatory Visit: Payer: Self-pay | Admitting: Orthopedic Surgery

## 2017-04-19 NOTE — H&P (Signed)
Jill Warren DOB: June 02, 1956 Married / Language: English / Race: White Female Date of Admission:  04/25/2017 CC:  Left hip pain History of Present Illness  The patient is a 61 year old female who comes in for a preoperative History and Physical. The patient is scheduled for a left total hip arthroplasty (anterior) to be performed by Dr. Dione Warren. Aluisio, MD at [facility] on 04-25-2017. The patient is a 61 year old female who presented for follow up of their hip. The patient is being followed for their left hip pain and osteoarthritis. They are months out from from IA injection with Dr Nelva Bush. Symptoms reported include: pain and no relief from the injection. The patient feels that they are doing poorly and report their pain level to be 5 / 10. Current treatment includes: home exercise program, activity modification. The following medication has been used for pain control: none. Note for "Follow-up Hip": She took an NSAID for her headache and it helped with her hip but caused stomach issues. Jill Warren was seen for worsening condition of her left hip. Unfortunately, the intraarticular injection only helped for a couple of weeks. She is now at the point where the hip is hurting with almost all activities. It is even hurting her at rest. It is something which she can and cannot do. She is ready to get the hip replaced at this time. They have been treated conservatively in the past for the above stated problem and despite conservative measures, they continue to have progressive pain and severe functional limitations and dysfunction. They have failed non-operative management including home exercise, medications, and injections. It is felt that they would benefit from undergoing total joint replacement. Risks and benefits of the procedure have been discussed with the patient and they elect to proceed with surgery. There are no active contraindications to surgery such as ongoing infection or rapidly  progressive neurological disease.  Problem List/Past Medical Sciatica of left side (M54.32)  Acute midline low back pain, with sciatica presence unspecified (M54.5)  Spinal stenosis of lumbar region with neurogenic claudication (V76.160)  Primary osteoarthritis of left hip (M16.12)  Lumbar DDD (M51.36)  Dependence on nocturnal oxygen therapy (Z99.81)  High blood pressure  Hypercholesterolemia  Migraine Headache  Skin Cancer  Shingles  Pneumonia  Past History Mumps  Menopause   Allergies  No Known Drug Allergies   Family History Cancer  Father, Sister. Congestive Heart Failure  Sister. First Degree Relatives  reported Heart disease in female family member before age 75  Father  Deceased. age 47 Mother  Living. age 103 Siblings  Sister - Non-hodgins Lymphoma, Pacemaker.  Social History Tobacco use  Never smoker. 12/01/2016 Children  0 Current drinker  12/01/2016: Currently drinks wine only occasionally per week Current work status  working full time Exercise  Inactive. Exercises rarely Living situation  live with spouse Marital status  married No history of drug/alcohol rehab  Not under pain contract  Number of flights of stairs before winded  1 Tobacco / smoke exposure  12/01/2016: no Alcohol use  Occasional alcohol use. 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 Lovastatin (40MG  Tablet, Oral) Active. Losartan Potassium (50MG  Tablet, Oral) Active.  Past Surgical History Ankle Surgery  Date: 04/2013. left Arthroscopy of Knee  Date: 05/2010. left Foot Surgery  left  Review of Systems  General Not Present- Chills, Fatigue, Fever, Memory Loss, Night Sweats, Weight Gain and  Weight Loss. Skin Not Present- Eczema, Hives, Itching, Lesions and Rash. HEENT Present- Headache and Vision problems (floaters). Not Present- Dentures, Double  Vision, Hearing Loss, Tinnitus and Visual Loss. Respiratory Not Present- Allergies, Chronic Cough, Coughing up blood, Shortness of breath at rest and Shortness of breath with exertion. Cardiovascular Not Present- Chest Pain, Difficulty Breathing Lying Down, Murmur, Palpitations, Racing/skipping heartbeats and Swelling. Gastrointestinal Present- Belching and Difficulty Swallowing. Not Present- Abdominal Pain, Bloody Stool, Constipation, Diarrhea, Heartburn, Jaundice, Loss of appetitie, Nausea and Vomiting. Female Genitourinary Not Present- Blood in Urine, Discharge, Flank Pain, Incontinence, Painful Urination, Urgency, Urinary frequency, Urinary Retention, Urinating at Night and Weak urinary stream. Musculoskeletal Present- Back Pain, Decreased Range of Motion and Joint Pain. Not Present- Joint Swelling, Morning Stiffness, Muscle Pain, Muscle Weakness and Spasms. Neurological Present- Headaches. Not Present- Blackout spells, Difficulty with balance, Dizziness, Paralysis, Tremor and Weakness. Psychiatric Not Present- Insomnia.  Vitals Weight: 200 lb Height: 64in Body Surface Area: 1.96 m Body Mass Index: 34.33 kg/m  Pulse: 56 (Regular)  BP: 138/72 (Sitting, Left Arm, Standard)   Physical Exam General Mental Status -Alert, cooperative and good historian. General Appearance-pleasant, Not in acute distress. Orientation-Oriented X3. Build & Nutrition-Well nourished and Well developed.  Head and Neck Head-normocephalic, atraumatic . Neck Global Assessment - supple, no bruit auscultated on the right, no bruit auscultated on the left.  Eye Pupil - Bilateral-Regular and Round. Motion - Bilateral-EOMI.  Chest and Lung Exam Auscultation Breath sounds - clear at anterior chest wall and clear at posterior chest wall. Adventitious sounds - No Adventitious sounds.  Cardiovascular Auscultation Rhythm - Regular rate and rhythm. Heart Sounds - S1 WNL and S2 WNL. Murmurs &  Other Heart Sounds - Auscultation of the heart reveals - No Murmurs.  Abdomen Inspection Contour - Generalized mild distention. Palpation/Percussion Tenderness - Abdomen is non-tender to palpation. Rigidity (guarding) - Abdomen is soft. Auscultation Auscultation of the abdomen reveals - Bowel sounds normal.  Female Genitourinary Note: Not done, not pertinent to present illness   Musculoskeletal Note: On exam, she is in no distress. Her right hip has normal motion with no discomfort. Left hip can be flexed to 100, minimal internal rotation, about 20 of external rotation, 20 of abduction. She has a significantly antalgic gait pattern. Her strength is intact with intact sensation and pulses.  RADIOGRAPHS I reviewed her radiographs from a few months ago. She does have bone-on-bone arthritis in the left hip with some subchondral cystic formation.  Assessment & Plan Primary osteoarthritis of left hip (M16.12)  Note:Surgical Plans: Left Total Hip Replacement - Anterior Approach  Disposition: Home  PCP: Dr. Philip Aspen - Patient has been seen preoperatively and felt to be stable for surgery.  Topical TXA - Metastatic Melanoma  Anesthesia Issues: None  Patient was instructed on what medications to stop prior to surgery.  Signed electronically by Joelene Millin, III PA-C

## 2017-04-20 ENCOUNTER — Ambulatory Visit (INDEPENDENT_AMBULATORY_CARE_PROVIDER_SITE_OTHER): Payer: Managed Care, Other (non HMO) | Admitting: Pulmonary Disease

## 2017-04-20 ENCOUNTER — Encounter (HOSPITAL_COMMUNITY)
Admission: RE | Admit: 2017-04-20 | Discharge: 2017-04-20 | Disposition: A | Discharge status: Home / Self Care | Payer: Managed Care, Other (non HMO) | Source: Ambulatory Visit | Attending: Orthopedic Surgery | Admitting: Orthopedic Surgery

## 2017-04-20 ENCOUNTER — Encounter (HOSPITAL_COMMUNITY): Payer: Self-pay

## 2017-04-20 ENCOUNTER — Encounter: Payer: Self-pay | Admitting: Pulmonary Disease

## 2017-04-20 ENCOUNTER — Telehealth: Payer: Self-pay

## 2017-04-20 VITALS — BP 114/78 | HR 92 | Ht 64.0 in | Wt 199.8 lb

## 2017-04-20 DIAGNOSIS — Z01812 Encounter for preprocedural laboratory examination: Secondary | ICD-10-CM | POA: Insufficient documentation

## 2017-04-20 DIAGNOSIS — J9611 Chronic respiratory failure with hypoxia: Secondary | ICD-10-CM

## 2017-04-20 DIAGNOSIS — R001 Bradycardia, unspecified: Secondary | ICD-10-CM | POA: Insufficient documentation

## 2017-04-20 DIAGNOSIS — M1612 Unilateral primary osteoarthritis, left hip: Secondary | ICD-10-CM | POA: Insufficient documentation

## 2017-04-20 DIAGNOSIS — Z01810 Encounter for preprocedural cardiovascular examination: Secondary | ICD-10-CM | POA: Insufficient documentation

## 2017-04-20 DIAGNOSIS — R911 Solitary pulmonary nodule: Secondary | ICD-10-CM | POA: Diagnosis not present

## 2017-04-20 HISTORY — DX: Dependence on supplemental oxygen: Z99.81

## 2017-04-20 HISTORY — DX: Essential (primary) hypertension: I10

## 2017-04-20 LAB — SURGICAL PCR SCREEN
MRSA, PCR: NEGATIVE
Staphylococcus aureus: NEGATIVE

## 2017-04-20 LAB — COMPREHENSIVE METABOLIC PANEL
ALBUMIN: 4.4 g/dL (ref 3.5–5.0)
ALT: 16 U/L (ref 14–54)
AST: 18 U/L (ref 15–41)
Alkaline Phosphatase: 42 U/L (ref 38–126)
Anion gap: 7 (ref 5–15)
BUN: 14 mg/dL (ref 6–20)
CHLORIDE: 105 mmol/L (ref 101–111)
CO2: 29 mmol/L (ref 22–32)
Calcium: 9.2 mg/dL (ref 8.9–10.3)
Creatinine, Ser: 0.81 mg/dL (ref 0.44–1.00)
GFR calc non Af Amer: >60 mL/min (ref >60)
GLUCOSE: 97 mg/dL (ref 65–99)
POTASSIUM: 4 mmol/L (ref 3.5–5.1)
SODIUM: 141 mmol/L (ref 135–145)
Total Bilirubin: 0.7 mg/dL (ref 0.3–1.2)
Total Protein: 7.3 g/dL (ref 6.5–8.1)

## 2017-04-20 LAB — CBC
HCT: 40.8 % (ref 36.0–46.0)
Hemoglobin: 13.6 g/dL (ref 12.0–15.0)
MCH: 30.7 pg (ref 26.0–34.0)
MCHC: 33.3 g/dL (ref 30.0–36.0)
MCV: 92.1 fL (ref 78.0–100.0)
PLATELETS: 222 10*3/uL (ref 150–400)
RBC: 4.43 MIL/uL (ref 3.87–5.11)
RDW: 12.7 % (ref 11.5–15.5)
WBC: 5.9 10*3/uL (ref 4.0–10.5)

## 2017-04-20 LAB — ABO/RH: ABO/RH(D): A POS

## 2017-04-20 LAB — PROTIME-INR
INR: 0.95
Prothrombin Time: 12.7 seconds (ref 11.4–15.2)

## 2017-04-20 LAB — APTT: aPTT: 27 seconds (ref 24–36)

## 2017-04-20 NOTE — Telephone Encounter (Signed)
Surgical clearance form has been completed by PM and faxed to provided number. Received fax confirmation. Nothing further needed

## 2017-04-20 NOTE — Progress Notes (Signed)
Jill Warren    [id number]    Jun 13, 1956  Primary Care Physician:Paterson, Quillian Quince, MD  Referring Physician: Leanna Battles, [place], [place] [address]  Chief complaint:   Follow up for nocturnal hypoxemia  HPI: Jill Warren is a 61 year old with past medical history of melanoma resection, hyperlipidemia. She was evaluated for headaches by sleep study last year which did not show any sleep apnea but prolonged desaturation to 85%. She is referred to [place] for further evaluation of any pulmonary abnormalities. She is already been started on 2 L oxygen at home and referred to the sleep clinic by her PCP, Dr. Philip Aspen.  She continues to have headaches at morning and occasionally during the daytime as well. She denies any pulmonary complaints of cough, sputum production, dyspnea, wheezing.  She said history of melanoma in the past and was followed at [facility] with follow-up CT scans. Her CT scan in 2013 did not show any pulmonary interstitial infiltrates. However she had a pulmonary nodule in the right middle lobe that needs further follow-up.  Interim history: She continues to feel well with no respiratory issues. She had a follow-up CT done this year at [facility] which shows persistent of lung nodules which may be slightly bigger She continues to have headaches. Still on nocturnal oxygen at 2 L. She is scheduled to undergo hip replacement next week.  Outpatient Encounter Prescriptions as of 04/20/2017  Medication Sig  . Bepotastine Besilate (BEPREVE) 1.5 % SOLN Place 1 drop into both eyes daily as needed (irritation).  Marland Kitchen losartan (COZAAR) 50 MG tablet Take 50 mg by mouth at bedtime.  . lovastatin (MEVACOR) 40 MG tablet Take 40 mg by mouth 3 (three) times a week.   . Probiotic Product (ALIGN) 4 MG CAPS Take 4 mg by mouth daily as needed (stomach health).  . triazolam (HALCION) 0.25 MG tablet Take 0.0625-0.125 mg by mouth at bedtime. 1/2 tab daily    . [DISCONTINUED] indomethacin (INDOCIN) 25 mg SUPP Place 1 suppository (25 mg total) rectally 2 (two) times daily.  . [DISCONTINUED] verapamil (CALAN) 40 MG tablet Take 1 tablet (40 mg total) by mouth 2 (two) times daily.  . [DISCONTINUED] zonisamide (ZONEGRAN) 25 MG capsule Take 1 capsule by mouth at night and one at 10 am.   No facility-administered encounter medications on file as of 04/20/2017.     Allergies as of 04/20/2017  . (No Known Allergies)    Past Medical History:  Diagnosis Date  . Cancer of small intestine ([place]) 2002  . Headache 2013   headaches DAILY X 4 YRS  . High cholesterol   . History of home oxygen therapy    PT NOT USE HOW MUCH   . Hypertension   . Melanoma ([place]) 1998   Skin started  . Other specified disorders of tendon, left ankle and foot    ruptured 2014    Past Surgical History:  Procedure Laterality Date  . ankle tendon Left   . APPENDECTOMY     WITH MELANOMA SURGERY  . KNEE ARTHROSCOPY Left   . melanoma removal      Family History  Problem Relation Age of Onset  . Stomach cancer Father   . Lymphoma Sister   . Congestive Heart Failure Unknown   . Brain cancer Paternal Uncle     Social History   Social History  . Marital status: Married    Spouse name: N/A  . Number  of children: 0  . 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 Smoking status: Never Smoker  . Smokeless tobacco: Never Used  . Alcohol use 0.0 oz/week     Comment: occ  . Drug use: No  . Sexual activity: Not on file   Other Topics Concern  . Not on file   Social History Narrative   Caffeine 2 cups daily avg.    Review of systems: Review of Systems  Constitutional: Negative for fever and chills.  HENT: Negative.   Eyes: Negative for blurred vision.  Respiratory: as per HPI  Cardiovascular: Negative for chest pain and palpitations.  Gastrointestinal: Negative for vomiting, diarrhea, blood  per rectum. Genitourinary: Negative for dysuria, urgency, frequency and hematuria.  Musculoskeletal: Negative for myalgias, back pain and joint pain.  Skin: Negative for itching and rash.  Neurological: Negative for dizziness, tremors, focal weakness, seizures and loss of consciousness.  Endo/Heme/Allergies: Negative for environmental allergies.  Psychiatric/Behavioral: Negative for depression, suicidal ideas and hallucinations.  All other systems reviewed and are negative.  Physical Exam: Blood pressure 114/78, pulse 92, height 5\' 4"  (1.626 m), weight 199 lb 12.8 oz (90.6 kg), SpO2 98 %. Gen:      No acute distress HEENT:  EOMI, sclera anicteric Neck:     No masses; no thyromegaly Lungs:    Clear to auscultation bilaterally; normal respiratory effort CV:         Regular rate and rhythm; no murmurs Abd:      + bowel sounds; soft, non-tender; no palpable masses, no distension Ext:    No edema; adequate peripheral perfusion Skin:      Warm and dry; no rash Neuro: alert and oriented x 3 Psych: normal mood and affect  Data Reviewed: Sleep study 02/20/15 AHI 1.5, RDI 1.5. The lowest oxygen desaturation was 84% with 69 minutes of desaturation between 60-90%.  CT chest 06/04/12 Right middle lobe mixed attenuation groundglass pulmonary nodule, measuring approximately 6 mm (series 3, image 142), previously 6 mm. No consolidative airspace disease. No suspicious appearing pulmonary nodules or masses. No pleural effusions.  CT chest 02/29/16 Right middle lobe nodule.Looks stable  CT 04/11/17 The previously noted nonsolid nodule within the lateral segment of the right middle lobe is seen on image 140 of series 3 and measures 8 mm in diameter. This is very faintly seen, and has an appearance suggestive of atypical adenomatous hyperplasia. Continued surveillance is suggested at 2 year intervals. No new evidence of metastatic disease to the thorax.  Labs 07/18/62 CBC, metabolic panel within normal  limits. Bicarbonate-26  ABG 02/17/16- 7.46/33/117/98  PFTs. 6/17 FVC 2.99 [91%)  FEV1 2.8 (91%) F/F 77 TLC 100%  DLCO 101%. Normal study.  Assessment:  #1 Nocturnal desaturation. Review of her blood work does not show elevated bicarbonate suggestive of chronic hypercarbia. She does not have pulmonary interstitial disease. PFTs, ABG reviewed with her which are normal. There is no evidence of OSA/OHS. She will continue the nocturnal O2. We will probably need a repeat sleep study later this year for reevaluation  #2 Lung nodule Lung nodule has grown from 6 in 2003 to 41mm in 2018 with appearance of adenomatous hyperplasia. Recommendation is to repeat CT in 2 years.  #3 preop for hip replacement She is cleared to undergo hip replacement. Recommendations are to continue supplemental oxygen especially at night to prevent hypoxia. If she has persistent hypoxia we can consider  empiric CPAP therapy. She'll need early ablation, adequate pain control and incentive spirometer.  Plan/Recommendations: - CT of the chest in 2 years - Continue nocturnal O2. Repeat sleep study later this year  Marshell Garfinkel MD [facility] Pager [phone] 04/20/2017, 5:08 PM  CC: Leanna Battles, MD

## 2017-04-20 NOTE — Patient Instructions (Signed)
Continue nocturnal oxygen I had reviewed his CT of the chest. Follow-up CT in 2 years Follow-up in 3 months.

## 2017-04-24 ENCOUNTER — Other Ambulatory Visit: Payer: Managed Care, Other (non HMO)

## 2017-04-24 ENCOUNTER — Encounter (HOSPITAL_COMMUNITY): Payer: Self-pay | Admitting: *Deleted

## 2017-04-25 ENCOUNTER — Inpatient Hospital Stay (HOSPITAL_COMMUNITY): Payer: Managed Care, Other (non HMO) | Admitting: Certified Registered Nurse Anesthetist

## 2017-04-25 ENCOUNTER — Inpatient Hospital Stay (HOSPITAL_COMMUNITY): Payer: Managed Care, Other (non HMO)

## 2017-04-25 ENCOUNTER — Encounter (HOSPITAL_COMMUNITY)
Admission: RE | Disposition: A | Discharge status: Home Health | Payer: Self-pay | Source: Ambulatory Visit | Attending: Orthopedic Surgery

## 2017-04-25 ENCOUNTER — Encounter (HOSPITAL_COMMUNITY): Payer: Self-pay

## 2017-04-25 ENCOUNTER — Inpatient Hospital Stay (HOSPITAL_COMMUNITY)
Admission: RE | Admit: 2017-04-25 | Discharge: 2017-04-26 | DRG: 470 | Disposition: A | Discharge status: Home Health | Payer: Managed Care, Other (non HMO) | Source: Ambulatory Visit | Attending: Orthopedic Surgery | Admitting: Orthopedic Surgery

## 2017-04-25 DIAGNOSIS — Z85820 Personal history of malignant melanoma of skin: Secondary | ICD-10-CM | POA: Diagnosis not present

## 2017-04-25 DIAGNOSIS — E669 Obesity, unspecified: Secondary | ICD-10-CM | POA: Diagnosis present

## 2017-04-25 DIAGNOSIS — Z9981 Dependence on supplemental oxygen: Secondary | ICD-10-CM

## 2017-04-25 DIAGNOSIS — G43909 Migraine, unspecified, not intractable, without status migrainosus: Secondary | ICD-10-CM | POA: Diagnosis present

## 2017-04-25 DIAGNOSIS — K219 Gastro-esophageal reflux disease without esophagitis: Secondary | ICD-10-CM | POA: Diagnosis present

## 2017-04-25 DIAGNOSIS — E7800 Pure hypercholesterolemia, unspecified: Secondary | ICD-10-CM | POA: Diagnosis present

## 2017-04-25 DIAGNOSIS — G4730 Sleep apnea, unspecified: Secondary | ICD-10-CM | POA: Diagnosis present

## 2017-04-25 DIAGNOSIS — Z6834 Body mass index (BMI) 34.0-34.9, adult: Secondary | ICD-10-CM

## 2017-04-25 DIAGNOSIS — Z85068 Personal history of other malignant neoplasm of small intestine: Secondary | ICD-10-CM | POA: Diagnosis not present

## 2017-04-25 DIAGNOSIS — Z96649 Presence of unspecified artificial hip joint: Secondary | ICD-10-CM

## 2017-04-25 DIAGNOSIS — I10 Essential (primary) hypertension: Secondary | ICD-10-CM | POA: Diagnosis present

## 2017-04-25 DIAGNOSIS — M1612 Unilateral primary osteoarthritis, left hip: Secondary | ICD-10-CM | POA: Diagnosis present

## 2017-04-25 DIAGNOSIS — M169 Osteoarthritis of hip, unspecified: Secondary | ICD-10-CM | POA: Diagnosis present

## 2017-04-25 HISTORY — PX: TOTAL HIP ARTHROPLASTY: SHX124

## 2017-04-25 LAB — TYPE AND SCREEN
ABO/RH(D): A POS
ANTIBODY SCREEN: NEGATIVE

## 2017-04-25 SURGERY — ARTHROPLASTY, HIP, TOTAL, ANTERIOR APPROACH
Anesthesia: Monitor Anesthesia Care | Site: Hip | Laterality: Left

## 2017-04-25 MED ORDER — TRANEXAMIC ACID 1000 MG/10ML IV SOLN
INTRAVENOUS | Status: AC | PRN
  Administered 2017-04-25: 2000 mg via TOPICAL

## 2017-04-25 MED ORDER — ACETAMINOPHEN 10 MG/ML IV SOLN
INTRAVENOUS | Status: AC
  Filled 2017-04-25: qty 100

## 2017-04-25 MED ORDER — TRANEXAMIC ACID 1000 MG/10ML IV SOLN
2000.0000 mg | Freq: Once | INTRAVENOUS | Status: DC
  Filled 2017-04-25: qty 20

## 2017-04-25 MED ORDER — PROPOFOL 10 MG/ML IV BOLUS
INTRAVENOUS | Status: AC
  Filled 2017-04-25: qty 60

## 2017-04-25 MED ORDER — FLEET ENEMA 7-19 GM/118ML RE ENEM: 1.0000 | ENEMA | Freq: Once | RECTAL | Status: DC | PRN

## 2017-04-25 MED ORDER — DEXAMETHASONE SODIUM PHOSPHATE 10 MG/ML IJ SOLN
10.0000 mg | Freq: Once | INTRAMUSCULAR | Status: AC
  Administered 2017-04-25: 10 mg via INTRAVENOUS

## 2017-04-25 MED ORDER — CEFAZOLIN SODIUM-DEXTROSE 2-4 GM/100ML-% IV SOLN
2.0000 g | Freq: Four times a day (QID) | INTRAVENOUS | Status: AC
  Administered 2017-04-25 (×2): 2 g via INTRAVENOUS
  Filled 2017-04-25 (×2): qty 100

## 2017-04-25 MED ORDER — FENTANYL CITRATE (PF) 100 MCG/2ML IJ SOLN
INTRAMUSCULAR | Status: AC
  Filled 2017-04-25: qty 2

## 2017-04-25 MED ORDER — DOCUSATE SODIUM 100 MG PO CAPS
100.0000 mg | ORAL_CAPSULE | Freq: Two times a day (BID) | ORAL | Status: DC
  Administered 2017-04-25 – 2017-04-26 (×2): 100 mg via ORAL
  Filled 2017-04-25 (×2): qty 1

## 2017-04-25 MED ORDER — POLYETHYLENE GLYCOL 3350 17 G PO PACK
17.0000 g | PACK | Freq: Every day | ORAL | Status: DC | PRN
  Administered 2017-04-25: 17 g via ORAL
  Filled 2017-04-25: qty 1

## 2017-04-25 MED ORDER — CEFAZOLIN SODIUM-DEXTROSE 2-4 GM/100ML-% IV SOLN
2.0000 g | INTRAVENOUS | Status: AC
  Administered 2017-04-25: 2 g via INTRAVENOUS

## 2017-04-25 MED ORDER — PHENOL 1.4 % MT LIQD: 1.0000 | OROMUCOSAL | Status: DC | PRN

## 2017-04-25 MED ORDER — STERILE WATER FOR IRRIGATION IR SOLN
Status: DC | PRN
  Administered 2017-04-25: 2000 mL

## 2017-04-25 MED ORDER — OXYCODONE HCL 5 MG/5ML PO SOLN
5.0000 mg | Freq: Once | ORAL | Status: DC | PRN
  Filled 2017-04-25: qty 5

## 2017-04-25 MED ORDER — HYDROMORPHONE HCL 1 MG/ML IJ SOLN
0.2500 mg | INTRAMUSCULAR | Status: DC | PRN
  Administered 2017-04-25: 0.5 mg via INTRAVENOUS

## 2017-04-25 MED ORDER — 0.9 % SODIUM CHLORIDE (POUR BTL) OPTIME
TOPICAL | Status: DC | PRN
  Administered 2017-04-25: 1000 mL

## 2017-04-25 MED ORDER — ACETAMINOPHEN 500 MG PO TABS
1000.0000 mg | ORAL_TABLET | Freq: Four times a day (QID) | ORAL | Status: AC
  Administered 2017-04-25 – 2017-04-26 (×4): 1000 mg via ORAL
  Filled 2017-04-25 (×4): qty 2

## 2017-04-25 MED ORDER — SODIUM CHLORIDE 0.9 % IV SOLN
INTRAVENOUS | Status: DC
  Administered 2017-04-25 – 2017-04-26 (×2): via INTRAVENOUS

## 2017-04-25 MED ORDER — METHOCARBAMOL 500 MG PO TABS
500.0000 mg | ORAL_TABLET | Freq: Four times a day (QID) | ORAL | Status: DC | PRN
  Administered 2017-04-25 – 2017-04-26 (×2): 500 mg via ORAL
  Filled 2017-04-25 (×2): qty 1

## 2017-04-25 MED ORDER — METOCLOPRAMIDE HCL 5 MG/ML IJ SOLN: 5.0000 mg | Freq: Three times a day (TID) | INTRAMUSCULAR | Status: DC | PRN

## 2017-04-25 MED ORDER — MENTHOL 3 MG MT LOZG: 1.0000 | LOZENGE | OROMUCOSAL | Status: DC | PRN

## 2017-04-25 MED ORDER — HYDROMORPHONE HCL 1 MG/ML IJ SOLN
INTRAMUSCULAR | Status: AC
  Filled 2017-04-25: qty 1

## 2017-04-25 MED ORDER — PROMETHAZINE HCL 25 MG/ML IJ SOLN: 6.2500 mg | INTRAMUSCULAR | Status: DC | PRN

## 2017-04-25 MED ORDER — LOSARTAN POTASSIUM 50 MG PO TABS
50.0000 mg | ORAL_TABLET | Freq: Every day | ORAL | Status: DC
  Administered 2017-04-25: 50 mg via ORAL
  Filled 2017-04-25: qty 1

## 2017-04-25 MED ORDER — BISACODYL 10 MG RE SUPP: 10.0000 mg | Freq: Every day | RECTAL | Status: DC | PRN

## 2017-04-25 MED ORDER — PROPOFOL 500 MG/50ML IV EMUL
INTRAVENOUS | Status: DC | PRN
  Administered 2017-04-25: 75 ug/kg/min via INTRAVENOUS

## 2017-04-25 MED ORDER — BUPIVACAINE HCL (PF) 0.5 % IJ SOLN
INTRAMUSCULAR | Status: DC | PRN
  Administered 2017-04-25: 3 mL

## 2017-04-25 MED ORDER — PROPOFOL 10 MG/ML IV BOLUS
INTRAVENOUS | Status: AC
  Filled 2017-04-25: qty 20

## 2017-04-25 MED ORDER — EPHEDRINE SULFATE 50 MG/ML IJ SOLN
INTRAMUSCULAR | Status: DC | PRN
  Administered 2017-04-25 (×2): 10 mg via INTRAVENOUS

## 2017-04-25 MED ORDER — BUPIVACAINE HCL (PF) 0.25 % IJ SOLN
INTRAMUSCULAR | Status: DC | PRN
  Administered 2017-04-25: 30 mL

## 2017-04-25 MED ORDER — CEFAZOLIN SODIUM-DEXTROSE 2-4 GM/100ML-% IV SOLN
INTRAVENOUS | Status: AC
  Filled 2017-04-25: qty 100

## 2017-04-25 MED ORDER — LACTATED RINGERS IV SOLN
INTRAVENOUS | Status: DC
  Administered 2017-04-25: 08:00:00 via INTRAVENOUS
  Administered 2017-04-25: 1000 mL via INTRAVENOUS
  Administered 2017-04-25: 09:00:00 via INTRAVENOUS

## 2017-04-25 MED ORDER — DIPHENHYDRAMINE HCL 12.5 MG/5ML PO ELIX: 12.5000 mg | ORAL_SOLUTION | ORAL | Status: DC | PRN

## 2017-04-25 MED ORDER — OXYCODONE HCL 5 MG PO TABS
5.0000 mg | ORAL_TABLET | ORAL | Status: DC | PRN
  Administered 2017-04-25: 22:00:00 10 mg via ORAL
  Administered 2017-04-25: 14:00:00 5 mg via ORAL
  Administered 2017-04-25 – 2017-04-26 (×3): 10 mg via ORAL
  Filled 2017-04-25 (×2): qty 2
  Filled 2017-04-25: qty 1
  Filled 2017-04-25 (×2): qty 2
  Filled 2017-04-25: qty 1

## 2017-04-25 MED ORDER — FENTANYL CITRATE (PF) 100 MCG/2ML IJ SOLN
INTRAMUSCULAR | Status: DC | PRN
Start: 2017-04-25 — End: 2017-04-25
  Administered 2017-04-25: 100 ug via INTRAVENOUS

## 2017-04-25 MED ORDER — MIDAZOLAM HCL 2 MG/2ML IJ SOLN
INTRAMUSCULAR | Status: AC
  Filled 2017-04-25: qty 2

## 2017-04-25 MED ORDER — METHOCARBAMOL 1000 MG/10ML IJ SOLN
500.0000 mg | Freq: Four times a day (QID) | INTRAVENOUS | Status: DC | PRN
  Administered 2017-04-25: 500 mg via INTRAVENOUS
  Filled 2017-04-25: qty 550

## 2017-04-25 MED ORDER — ACETAMINOPHEN 10 MG/ML IV SOLN
1000.0000 mg | Freq: Once | INTRAVENOUS | Status: AC
  Administered 2017-04-25: 1000 mg via INTRAVENOUS

## 2017-04-25 MED ORDER — ACETAMINOPHEN 325 MG PO TABS: 650.0000 mg | ORAL_TABLET | Freq: Four times a day (QID) | ORAL | Status: DC | PRN

## 2017-04-25 MED ORDER — ONDANSETRON HCL 4 MG PO TABS: 4.0000 mg | ORAL_TABLET | Freq: Four times a day (QID) | ORAL | Status: DC | PRN

## 2017-04-25 MED ORDER — MIDAZOLAM HCL 5 MG/5ML IJ SOLN
INTRAMUSCULAR | Status: DC | PRN
  Administered 2017-04-25: 2 mg via INTRAVENOUS

## 2017-04-25 MED ORDER — OXYCODONE HCL 5 MG PO TABS: 5.0000 mg | ORAL_TABLET | Freq: Once | ORAL | Status: DC | PRN

## 2017-04-25 MED ORDER — TRIAZOLAM 0.125 MG PO TABS: 0.0625 mg | ORAL_TABLET | Freq: Every day | ORAL | Status: DC

## 2017-04-25 MED ORDER — MORPHINE SULFATE (PF) 2 MG/ML IV SOLN
1.0000 mg | INTRAVENOUS | Status: DC | PRN
  Administered 2017-04-25: 16:00:00 1 mg via INTRAVENOUS
  Filled 2017-04-25: qty 1

## 2017-04-25 MED ORDER — PHENYLEPHRINE HCL 10 MG/ML IJ SOLN
INTRAMUSCULAR | Status: DC | PRN
  Administered 2017-04-25 (×4): 80 ug via INTRAVENOUS

## 2017-04-25 MED ORDER — ONDANSETRON HCL 4 MG/2ML IJ SOLN: 4.0000 mg | Freq: Four times a day (QID) | INTRAMUSCULAR | Status: DC | PRN

## 2017-04-25 MED ORDER — TRAMADOL HCL 50 MG PO TABS: 50.0000 mg | ORAL_TABLET | Freq: Four times a day (QID) | ORAL | Status: DC | PRN

## 2017-04-25 MED ORDER — RIVAROXABAN 10 MG PO TABS
10.0000 mg | ORAL_TABLET | Freq: Every day | ORAL | Status: DC
  Administered 2017-04-26: 08:00:00 10 mg via ORAL
  Filled 2017-04-25: qty 1

## 2017-04-25 MED ORDER — ACETAMINOPHEN 650 MG RE SUPP: 650.0000 mg | Freq: Four times a day (QID) | RECTAL | Status: DC | PRN

## 2017-04-25 MED ORDER — VANCOMYCIN HCL IN DEXTROSE 1-5 GM/200ML-% IV SOLN
INTRAVENOUS | Status: AC
  Filled 2017-04-25: qty 200

## 2017-04-25 MED ORDER — METOCLOPRAMIDE HCL 5 MG PO TABS: 5.0000 mg | ORAL_TABLET | Freq: Three times a day (TID) | ORAL | Status: DC | PRN

## 2017-04-25 MED ORDER — DEXAMETHASONE SODIUM PHOSPHATE 10 MG/ML IJ SOLN
10.0000 mg | Freq: Once | INTRAMUSCULAR | Status: AC
  Administered 2017-04-26: 10 mg via INTRAVENOUS
  Filled 2017-04-25: qty 1

## 2017-04-25 MED ORDER — BUPIVACAINE HCL (PF) 0.25 % IJ SOLN
INTRAMUSCULAR | Status: AC
  Filled 2017-04-25: qty 30

## 2017-04-25 SURGICAL SUPPLY — 42 items
BAG DECANTER FOR FLEXI CONT (MISCELLANEOUS) ×3 IMPLANT
BAG SPEC THK2 15X12 ZIP CLS (MISCELLANEOUS) ×1
BAG ZIPLOCK 12X15 (MISCELLANEOUS) ×2 IMPLANT
BLADE SAG 18X100X1.27 (BLADE) ×3 IMPLANT
CAPT HIP TOTAL 2 ×2 IMPLANT
CLOSURE WOUND 1/2 X4 (GAUZE/BANDAGES/DRESSINGS) ×2
CLOTH BEACON ORANGE TIMEOUT ST (SAFETY) ×3 IMPLANT
COVER PERINEAL POST (MISCELLANEOUS) ×3 IMPLANT
COVER SURGICAL LIGHT HANDLE (MISCELLANEOUS) ×3 IMPLANT
CUP ACET PINNACLE SECTR 50MM (Hips) IMPLANT
DECANTER SPIKE VIAL GLASS SM (MISCELLANEOUS) ×3 IMPLANT
DRAPE STERI IOBAN 125X83 (DRAPES) ×3 IMPLANT
DRAPE U-SHAPE 47X51 STRL (DRAPES) ×6 IMPLANT
DRSG ADAPTIC 3X8 NADH LF (GAUZE/BANDAGES/DRESSINGS) ×3 IMPLANT
DRSG MEPILEX BORDER 4X4 (GAUZE/BANDAGES/DRESSINGS) ×3 IMPLANT
DRSG MEPILEX BORDER 4X8 (GAUZE/BANDAGES/DRESSINGS) ×3 IMPLANT
DURAPREP 26ML APPLICATOR (WOUND CARE) ×3 IMPLANT
ELECT REM PT RETURN 15FT ADLT (MISCELLANEOUS) ×3 IMPLANT
EVACUATOR 1/8 PVC DRAIN (DRAIN) ×3 IMPLANT
GLOVE BIO SURGEON STRL SZ7.5 (GLOVE) ×3 IMPLANT
GLOVE BIO SURGEON STRL SZ8 (GLOVE) ×6 IMPLANT
GLOVE BIOGEL PI IND STRL 7.5 (GLOVE) IMPLANT
GLOVE BIOGEL PI IND STRL 8 (GLOVE) ×2 IMPLANT
GLOVE BIOGEL PI INDICATOR 7.5 (GLOVE) ×8
GLOVE BIOGEL PI INDICATOR 8 (GLOVE) ×4
GLOVE SURG SS PI 7.5 STRL IVOR (GLOVE) ×4 IMPLANT
GOWN SPEC L3 XXLG W/TWL (GOWN DISPOSABLE) ×2 IMPLANT
GOWN STRL REUS W/ TWL XL LVL3 (GOWN DISPOSABLE) IMPLANT
GOWN STRL REUS W/TWL LRG LVL3 (GOWN DISPOSABLE) ×3 IMPLANT
GOWN STRL REUS W/TWL XL LVL3 (GOWN DISPOSABLE) ×6 IMPLANT
PACK ANTERIOR HIP CUSTOM (KITS) ×3 IMPLANT
PINNACLE SECTOR CUP 50MM (Hips) ×3 IMPLANT
STRIP CLOSURE SKIN 1/2X4 (GAUZE/BANDAGES/DRESSINGS) ×3 IMPLANT
SUT ETHIBOND NAB CT1 #1 30IN (SUTURE) ×3 IMPLANT
SUT MNCRL AB 4-0 PS2 18 (SUTURE) ×5 IMPLANT
SUT STRATAFIX 0 PDS 27 VIOLET (SUTURE) ×6
SUT VIC AB 2-0 CT1 27 (SUTURE) ×6
SUT VIC AB 2-0 CT1 TAPERPNT 27 (SUTURE) ×2 IMPLANT
SUTURE STRATFX 0 PDS 27 VIOLET (SUTURE) ×1 IMPLANT
SYR 50ML LL SCALE MARK (SYRINGE) ×2 IMPLANT
TRAY FOLEY CATH SILVER 14FR (SET/KITS/TRAYS/PACK) ×2 IMPLANT
YANKAUER SUCT BULB TIP 10FT TU (MISCELLANEOUS) ×3 IMPLANT

## 2017-04-25 NOTE — Transfer of Care (Signed)
Immediate Anesthesia Transfer of Care Note  Patient: Jill Warren  Procedure(s) Performed: Procedure(s): LEFT TOTAL HIP ARTHROPLASTY ANTERIOR APPROACH (Left)  Patient Location: PACU  Anesthesia Type:General  Level of Consciousness: awake, alert  and oriented  Airway & Oxygen Therapy: Patient Spontanous Breathing and Patient connected to face mask oxygen  Post-op Assessment: Report given to RN and Post -op Vital signs reviewed and stable  Post vital signs: Reviewed and stable  Last Vitals:  Vitals:   04/25/17 0630  BP: (!) 148/77  Pulse: 65  Resp: 16  Temp: 36.9 C    Last Pain: There were no vitals filed for this visit.    Patients Stated Pain Goal: 4 (94/44/61 9012)  Complications: No apparent anesthesia complications

## 2017-04-25 NOTE — Anesthesia Postprocedure Evaluation (Signed)
Anesthesia Post Note  Patient: Jill Warren  Procedure(s) Performed: Procedure(s) (LRB): LEFT TOTAL HIP ARTHROPLASTY ANTERIOR APPROACH (Left)     Patient location during evaluation: PACU Anesthesia Type: MAC Level of consciousness: oriented and awake and alert Pain management: pain level controlled Vital Signs Assessment: post-procedure vital signs reviewed and stable Respiratory status: spontaneous breathing and respiratory function stable Cardiovascular status: blood pressure returned to baseline and stable Postop Assessment: no headache and no backache Anesthetic complications: no    Last Vitals:  Vitals:   04/25/17 1045 04/25/17 1100  BP: (!) 111/58 101/63  Pulse: (!) 56 (!) 56  Resp: 12 14  Temp:      Last Pain:  Vitals:   04/25/17 1100  PainSc: 0-No pain                 Lynda Rainwater

## 2017-04-25 NOTE — Evaluation (Signed)
Physical Therapy Evaluation Patient Details Name: Jill Warren MRN: [id number] DOB: October 09, 1956 Today's Date: 04/25/2017   History of Present Illness  61 yo female s/p L THA-direct anterior 04/28/17. Hx of spinal stenosis.   Clinical Impression  On eval POD 0, pt required Min assist for mobility. She walked ~50 feet with a RW. Moderate pain with activity. Will progress activity as tolerated. Plan is for possible d/c tomorrow.     Follow Up Recommendations DC plan and follow up therapy as arranged by surgeon (could benefit from [place] PT)    Equipment Recommendations  None recommended by PT    Recommendations for Other Services       Precautions / Restrictions Precautions Precautions: Fall Restrictions Weight Bearing Restrictions: No LLE Weight Bearing: Weight bearing as tolerated      Mobility  Bed Mobility Overal bed mobility: Needs Assistance Bed Mobility: Supine to Sit     Supine to sit: Min assist;HOB elevated     General bed mobility comments: assist for L LE. Increased time. VCs technique.   Transfers Overall transfer level: Needs assistance Equipment used: Rolling walker (2 wheeled) Transfers: Sit to/from Stand Sit to Stand: Min assist         General transfer comment: Assist to rise, stabilize, control descent. VCs safety, technique, hand/LE placement  Ambulation/Gait Ambulation/Gait assistance: Min assist Ambulation Distance (Feet): 50 Feet Assistive device: Rolling walker (2 wheeled) Gait Pattern/deviations: Step-to pattern;Antalgic     General Gait Details: VCs safety, technique, sequence. Assist to stabilize. Slow gait speed.   Stairs            Wheelchair Mobility    Modified Rankin (Stroke Patients Only)       Balance                                             Pertinent Vitals/Pain Pain Assessment: 0-10 Pain Score: 5  Pain Location: L hip with activity Pain Descriptors / Indicators:  Sore;Sharp;Aching Pain Intervention(s): Monitored during session;Repositioned;Ice applied    Home Living Family/patient expects to be discharged to:: Private residence Living Arrangements: Spouse/significant other Available Help at Discharge: Family Type of Home: House Home Access: [place]: One [place]: Environmental consultant - 2 wheels;Bedside commode;Cane - single point      Prior Function Level of Independence: Independent               Hand Dominance        Extremity/Trunk Assessment   Upper Extremity Assessment Upper Extremity Assessment: Overall WFL for tasks assessed    Lower Extremity Assessment Lower Extremity Assessment: Generalized weakness (s/p L THA)    Cervical / Trunk Assessment Cervical / Trunk Assessment: Normal  Communication   Communication: No difficulties  Cognition Arousal/Alertness: Awake/alert Behavior During Therapy: WFL for tasks assessed/performed Overall Cognitive Status: Within Functional Limits for tasks assessed                                        General Comments      Exercises     Assessment/Plan    PT Assessment Patient needs continued PT services  PT Problem List Decreased strength;Decreased mobility;Decreased range of motion;Decreased balance;Decreased knowledge of use of DME;Pain  PT Treatment Interventions DME instruction;Therapeutic activities;Gait training;Therapeutic exercise;Patient/family education;Functional mobility training;Balance training    PT Goals (Current goals can be found in the Care Plan section)  Acute Rehab PT Goals Patient Stated Goal: none stated PT Goal Formulation: With patient/family Time For Goal Achievement: 05/09/17 Potential to Achieve Goals: Good    Frequency 7X/week   Barriers to discharge        Co-evaluation               AM-PAC PT "6 Clicks" Daily Activity  Outcome Measure Difficulty turning over in bed (including  adjusting bedclothes, sheets and blankets)?: A Little Difficulty moving from lying on back to sitting on the side of the bed? : A Little Difficulty sitting down on and standing up from a chair with arms (e.g., wheelchair, bedside commode, etc,.)?: A Little Help needed moving to and from a bed to chair (including a wheelchair)?: A Little Help needed walking in hospital room?: A Little Help needed climbing 3-5 steps with a railing? : A Little 6 Click Score: 18    End of Session Equipment Utilized During Treatment: Gait belt Activity Tolerance: Patient tolerated treatment well Patient left: in chair;with call bell/phone within reach;with family/visitor present   PT Visit Diagnosis: Muscle weakness (generalized) (M62.81);Difficulty in walking, not elsewhere classified (R26.2)    Time: 3500-9381 PT Time Calculation (min) (ACUTE ONLY): 15 min   Charges:   PT Evaluation $PT Eval Low Complexity: 1 Procedure     PT G Codes:          Weston Anna, MPT Pager: [phone]

## 2017-04-25 NOTE — Anesthesia Procedure Notes (Signed)
Spinal  Start time: 04/25/2017 8:25 AM End time: 04/25/2017 8:31 AM Staffing Resident/CRNA: Gean Maidens Performed: resident/CRNA  Preanesthetic Checklist Completed: patient identified, site marked, surgical consent, pre-op evaluation, timeout performed, IV checked, risks and benefits discussed and monitors and equipment checked Spinal Block Patient position: sitting Prep: Betasept Patient monitoring: heart rate, continuous pulse ox and blood pressure Approach: midline Location: L3-4 Injection technique: single-shot Needle Needle type: Quincke  Needle gauge: 22 G Needle length: 9 cm Needle insertion depth: 7 cm Additional Notes Pt sitting position, sterile prep and drape, negative paresthesia, negative heme.

## 2017-04-25 NOTE — H&P (View-Only) (Signed)
Jill Warren DOB: 19-Nov-1955 Married / Language: English / Race: White Female Date of Admission:  04/25/2017 CC:  Left hip pain History of Present Illness  The patient is a 61 year old female who comes in for a preoperative History and Physical. The patient is scheduled for a left total hip arthroplasty (anterior) to be performed by Dr. Dione Warren. Aluisio, MD at [facility] on 04-25-2017. The patient is a 61 year old female who presented for follow up of their hip. The patient is being followed for their left hip pain and osteoarthritis. They are months out from from IA injection with Dr Jill Warren. Symptoms reported include: pain and no relief from the injection. The patient feels that they are doing poorly and report their pain level to be 5 / 10. Current treatment includes: home exercise program, activity modification. The following medication has been used for pain control: none. Note for "Follow-up Hip": She took an NSAID for her headache and it helped with her hip but caused stomach issues. Jill Warren was seen for worsening condition of her left hip. Unfortunately, the intraarticular injection only helped for a couple of weeks. She is now at the point where the hip is hurting with almost all activities. It is even hurting her at rest. It is something which she can and cannot do. She is ready to get the hip replaced at this time. They have been treated conservatively in the past for the above stated problem and despite conservative measures, they continue to have progressive pain and severe functional limitations and dysfunction. They have failed non-operative management including home exercise, medications, and injections. It is felt that they would benefit from undergoing total joint replacement. Risks and benefits of the procedure have been discussed with the patient and they elect to proceed with surgery. There are no active contraindications to surgery such as ongoing infection or rapidly  progressive neurological disease.  Problem List/Past Medical Sciatica of left side (M54.32)  Acute midline low back pain, with sciatica presence unspecified (M54.5)  Spinal stenosis of lumbar region with neurogenic claudication (B01.751)  Primary osteoarthritis of left hip (M16.12)  Lumbar DDD (M51.36)  Dependence on nocturnal oxygen therapy (Z99.81)  High blood pressure  Hypercholesterolemia  Migraine Headache  Skin Cancer  Shingles  Pneumonia  Past History Mumps  Menopause   Allergies  No Known Drug Allergies   Family History Cancer  Father, Sister. Congestive Heart Failure  Sister. First Degree Relatives  reported Heart disease in female family member before age 86  Father  Deceased. age 65 Mother  Living. age 81 Siblings  Sister - Non-hodgins Lymphoma, Pacemaker.  Social History Tobacco use  Never smoker. 12/01/2016 Children  0 Current drinker  12/01/2016: Currently drinks wine only occasionally per week Current work status  working full time Exercise  Inactive. Exercises rarely Living situation  live with spouse Marital status  married No history of drug/alcohol rehab  Not under pain contract  Number of flights of stairs before winded  1 Tobacco / smoke exposure  12/01/2016: no Alcohol use  Occasional alcohol use. 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 Lovastatin (40MG  Tablet, Oral) Active. Losartan Potassium (50MG  Tablet, Oral) Active.  Past Surgical History Ankle Surgery  Date: 04/2013. left Arthroscopy of Knee  Date: 05/2010. left Foot Surgery  left  Review of Systems  General Not Present- Chills, Fatigue, Fever, Memory Loss, Night Sweats, Weight Gain and  Weight Loss. Skin Not Present- Eczema, Hives, Itching, Lesions and Rash. HEENT Present- Headache and Vision problems (floaters). Not Present- Dentures, Double  Vision, Hearing Loss, Tinnitus and Visual Loss. Respiratory Not Present- Allergies, Chronic Cough, Coughing up blood, Shortness of breath at rest and Shortness of breath with exertion. Cardiovascular Not Present- Chest Pain, Difficulty Breathing Lying Down, Murmur, Palpitations, Racing/skipping heartbeats and Swelling. Gastrointestinal Present- Belching and Difficulty Swallowing. Not Present- Abdominal Pain, Bloody Stool, Constipation, Diarrhea, Heartburn, Jaundice, Loss of appetitie, Nausea and Vomiting. Female Genitourinary Not Present- Blood in Urine, Discharge, Flank Pain, Incontinence, Painful Urination, Urgency, Urinary frequency, Urinary Retention, Urinating at Night and Weak urinary stream. Musculoskeletal Present- Back Pain, Decreased Range of Motion and Joint Pain. Not Present- Joint Swelling, Morning Stiffness, Muscle Pain, Muscle Weakness and Spasms. Neurological Present- Headaches. Not Present- Blackout spells, Difficulty with balance, Dizziness, Paralysis, Tremor and Weakness. Psychiatric Not Present- Insomnia.  Vitals Weight: 200 lb Height: 64in Body Surface Area: 1.96 m Body Mass Index: 34.33 kg/m  Pulse: 56 (Regular)  BP: 138/72 (Sitting, Left Arm, Standard)   Physical Exam General Mental Status -Alert, cooperative and good historian. General Appearance-pleasant, Not in acute distress. Orientation-Oriented X3. Build & Nutrition-Well nourished and Well developed.  Head and Neck Head-normocephalic, atraumatic . Neck Global Assessment - supple, no bruit auscultated on the right, no bruit auscultated on the left.  Eye Pupil - Bilateral-Regular and Round. Motion - Bilateral-EOMI.  Chest and Lung Exam Auscultation Breath sounds - clear at anterior chest wall and clear at posterior chest wall. Adventitious sounds - No Adventitious sounds.  Cardiovascular Auscultation Rhythm - Regular rate and rhythm. Heart Sounds - S1 WNL and S2 WNL. Murmurs &  Other Heart Sounds - Auscultation of the heart reveals - No Murmurs.  Abdomen Inspection Contour - Generalized mild distention. Palpation/Percussion Tenderness - Abdomen is non-tender to palpation. Rigidity (guarding) - Abdomen is soft. Auscultation Auscultation of the abdomen reveals - Bowel sounds normal.  Female Genitourinary Note: Not done, not pertinent to present illness   Musculoskeletal Note: On exam, she is in no distress. Her right hip has normal motion with no discomfort. Left hip can be flexed to 100, minimal internal rotation, about 20 of external rotation, 20 of abduction. She has a significantly antalgic gait pattern. Her strength is intact with intact sensation and pulses.  RADIOGRAPHS I reviewed her radiographs from a few months ago. She does have bone-on-bone arthritis in the left hip with some subchondral cystic formation.  Assessment & Plan Primary osteoarthritis of left hip (M16.12)  Note:Surgical Plans: Left Total Hip Replacement - Anterior Approach  Disposition: Home  PCP: Dr. Philip Aspen - Patient has been seen preoperatively and felt to be stable for surgery.  Topical TXA - Metastatic Melanoma  Anesthesia Issues: None  Patient was instructed on what medications to stop prior to surgery.  Signed electronically by Joelene Millin, III PA-C

## 2017-04-25 NOTE — Interval H&P Note (Signed)
History and Physical Interval Note:  04/25/2017 7:15 AM  Jill Warren  has presented today for surgery, with the diagnosis of Osteoarthritis Left hip  The various methods of treatment have been discussed with the patient and family. After consideration of risks, benefits and other options for treatment, the patient has consented to  Procedure(s): LEFT TOTAL HIP ARTHROPLASTY ANTERIOR APPROACH (Left) as a surgical intervention .  The patient's history has been reviewed, patient examined, no change in status, stable for surgery.  I have reviewed the patient's chart and labs.  Questions were answered to the patient's satisfaction.     Gearlean Alf

## 2017-04-25 NOTE — Op Note (Signed)
OPERATIVE REPORT- TOTAL HIP ARTHROPLASTY   PREOPERATIVE DIAGNOSIS: Osteoarthritis of the Left hip.   POSTOPERATIVE DIAGNOSIS: Osteoarthritis of the Left  hip.   PROCEDURE: Left total hip arthroplasty, anterior approach.   SURGEON: Gaynelle Arabian, MD   ASSISTANT: Arlee Muslim, PA-C  ANESTHESIA:  Spinal  ESTIMATED BLOOD LOSS:-250 ml   DRAINS: Hemovac x1.   COMPLICATIONS: None   CONDITION: PACU - hemodynamically stable.   BRIEF CLINICAL NOTE: Jill Warren is a 61 y.o. female who has advanced end-  stage arthritis of their Left  hip with progressively worsening pain and  dysfunction.The patient has failed nonoperative management and presents for  total hip arthroplasty.   PROCEDURE IN DETAIL: After successful administration of spinal  anesthetic, the traction boots for the [facility] bed were placed on both  feet and the patient was placed onto the [employer] bed, boots placed into the leg  holders. The Left hip was then isolated from the perineum with plastic  drapes and prepped and draped in the usual sterile fashion. ASIS and  greater trochanter were marked and a oblique incision was made, starting  at about 1 cm lateral and 2 cm distal to the ASIS and coursing towards  the anterior cortex of the femur. The skin was cut with a 10 blade  through subcutaneous tissue to the level of the fascia overlying the  tensor fascia lata muscle. The fascia was then incised in line with the  incision at the junction of the anterior third and posterior 2/3rd. The  muscle was teased off the fascia and then the interval between the TFL  and the rectus was developed. The Hohmann retractor was then placed at  the top of the femoral neck over the capsule. The vessels overlying the  capsule were cauterized and the fat on top of the capsule was removed.  A Hohmann retractor was then placed anterior underneath the rectus  femoris to give exposure to the entire anterior capsule. A T-shaped   capsulotomy was performed. The edges were tagged and the femoral head  was identified.       Osteophytes are removed off the superior acetabulum.  The femoral neck was then cut in situ with an oscillating saw. Traction  was then applied to the left lower extremity utilizing the [facility]  traction. The femoral head was then removed. Retractors were placed  around the acetabulum and then circumferential removal of the labrum was  performed. Osteophytes were also removed. Reaming starts at 45 mm to  medialize and  Increased in 2 mm increments to 49 mm. We reamed in  approximately 40 degrees of abduction, 20 degrees anteversion. A 50 mm  pinnacle acetabular shell was then impacted in anatomic position under  fluoroscopic guidance with excellent purchase. We did not need to place  any additional dome screws. A 32 mm neutral + 4 marathon liner was then  placed into the acetabular shell.       The femoral lift was then placed along the lateral aspect of the femur  just distal to the vastus ridge. The leg was  externally rotated and capsule  was stripped off the inferior aspect of the femoral neck down to the  level of the lesser trochanter, this was done with electrocautery. The femur was lifted after this was performed. The  leg was then placed in an extended and adducted position essentially delivering the femur. We also removed the capsule superiorly and the piriformis from the piriformis fossa  to gain excellent exposure of the  proximal femur. Rongeur was used to remove some cancellous bone to get  into the lateral portion of the proximal femur for placement of the  initial starter reamer. The starter broaches was placed  the starter broach  and was shown to go down the center of the canal. Broaching  with the  Corail system was then performed starting at size 8, coursing  Up to size 9. A size 9 had excellent torsional and rotational  and axial stability. The trial standard offset neck was then  placed  with a 32 + 1 trial head. The hip was then reduced. We confirmed that  the stem was in the canal both on AP and lateral x-rays. It also has excellent sizing. The hip was reduced with outstanding stability through full extension and full external rotation.. AP pelvis was taken and the leg lengths were measured and found to be equal. Hip was then dislocated again and the femoral head and neck removed. The  femoral broach was removed. Size 9 Corail stem with a standard offset  neck was then impacted into the femur following native anteversion. Has  excellent purchase in the canal. Excellent torsional and rotational and  axial stability. It is confirmed to be in the canal on AP and lateral  fluoroscopic views. The 32 + 1 ceramic head was placed and the hip  reduced with outstanding stability. Again AP pelvis was taken and it  confirmed that the leg lengths were equal. The wound was then copiously  irrigated with saline solution and the capsule reattached and repaired  with Ethibond suture. 30 ml of .25% Bupivicaine was  injected into the capsule and into the edge of the tensor fascia lata as well as subcutaneous tissue. The fascia overlying the tensor fascia lata was then closed with a running #1 V-Loc. Subcu was closed with interrupted 2-0 Vicryl and subcuticular running 4-0 Monocryl. Incision was cleaned  and dried. Steri-Strips and a bulky sterile dressing applied. Hemovac  drain was hooked to suction and then the patient was awakened and transported to  recovery in stable condition.        Please note that a surgical assistant was a medical necessity for this procedure to perform it in a safe and expeditious manner. Assistant was necessary to provide appropriate retraction of vital neurovascular structures and to prevent femoral fracture and allow for anatomic placement of the prosthesis.  Gaynelle Arabian, M.D.

## 2017-04-25 NOTE — Discharge Instructions (Addendum)
° °Dr. Frank Aluisio °Total Joint Specialist °[facility] °[address] °[place], [place] [address] °[phone] ° °ANTERIOR APPROACH TOTAL HIP REPLACEMENT POSTOPERATIVE DIRECTIONS ° ° °Hip Rehabilitation, Guidelines Following Surgery  °The results of a hip operation are greatly improved after range of motion and muscle strengthening exercises. Follow all safety measures which are given to protect your hip. If any of these exercises cause increased pain or swelling in your joint, decrease the amount until you are comfortable again. Then slowly increase the exercises. Call your caregiver if you have problems or questions.  ° °HOME CARE INSTRUCTIONS  °Remove items at home which could result in a fall. This includes throw rugs or furniture in walking pathways.  °· ICE to the affected hip every three hours for 30 minutes at a time and then as needed for pain and swelling.  Continue to use ice on the hip for pain and swelling from surgery. You may notice swelling that will progress down to the foot and ankle.  This is normal after surgery.  Elevate the leg when you are not up walking on it.   °· Continue to use the breathing machine which will help keep your temperature down.  It is common for your temperature to cycle up and down following surgery, especially at night when you are not up moving around and exerting yourself.  The breathing machine keeps your lungs expanded and your temperature down. ° ° °DIET °You may resume your previous home diet once your are discharged from the hospital. ° °DRESSING / WOUND CARE / SHOWERING °You may shower 3 days after surgery, but keep the wounds dry during showering.  You may use an occlusive plastic wrap (Press'n Seal for example), NO SOAKING/SUBMERGING IN THE BATHTUB.  If the bandage gets wet, change with a clean dry gauze.  If the incision gets wet, pat the wound dry with a clean towel. °You may start showering once you are discharged home but do not  submerge the incision under water. Just pat the incision dry and apply a dry gauze dressing on daily. °Change the surgical dressing daily and reapply a dry dressing each time. ° °ACTIVITY °Walk with your walker as instructed. °Use walker as long as suggested by your caregivers. °Avoid periods of inactivity such as sitting longer than an hour when not asleep. This helps prevent blood clots.  °You may resume a sexual relationship in one month or when given the OK by your doctor.  °You may return to work once you are cleared by your doctor.  °Do not drive a car for 6 weeks or until released by you surgeon.  °Do not drive while taking narcotics. ° °WEIGHT BEARING °Weight bearing as tolerated with assist device (walker, cane, etc) as directed, use it as long as suggested by your surgeon or therapist, typically at least 4-6 weeks. ° °POSTOPERATIVE CONSTIPATION PROTOCOL °Constipation - defined medically as fewer than three stools per week and severe constipation as less than one stool per week. ° °One of the most common issues patients have following surgery is constipation.  Even if you have a regular bowel pattern at home, your normal regimen is likely to be disrupted due to multiple reasons following surgery.  Combination of anesthesia, postoperative narcotics, change in appetite and fluid intake all can affect your bowels.  In order to avoid complications following surgery, here are some recommendations in order to help you during your recovery period. ° °Colace (docusate) - Pick up an over-the-counter   form of Colace or another stool softener and take twice a day as long as you are requiring postoperative pain medications.  Take with a full glass of water daily.  If you experience loose stools or diarrhea, hold the colace until you stool forms back up.  If your symptoms do not get better within 1 week or if they get worse, check with your doctor. ° °Dulcolax (bisacodyl) - Pick up over-the-counter and take as directed  by the product packaging as needed to assist with the movement of your bowels.  Take with a full glass of water.  Use this product as needed if not relieved by Colace only.  ° °MiraLax (polyethylene glycol) - Pick up over-the-counter to have on hand.  MiraLax is a solution that will increase the amount of water in your bowels to assist with bowel movements.  Take as directed and can mix with a glass of water, juice, soda, coffee, or tea.  Take if you go more than two days without a movement. °Do not use MiraLax more than once per day. Call your doctor if you are still constipated or irregular after using this medication for 7 days in a row. ° °If you continue to have problems with postoperative constipation, please contact the office for further assistance and recommendations.  If you experience "the worst abdominal pain ever" or develop nausea or vomiting, please contact the office immediatly for further recommendations for treatment. ° °ITCHING ° If you experience itching with your medications, try taking only a single pain pill, or even half a pain pill at a time.  You can also use Benadryl over the counter for itching or also to help with sleep.  ° °TED HOSE STOCKINGS °Wear the elastic stockings on both legs for three weeks following surgery during the day but you may remove then at night for sleeping. ° °MEDICATIONS °See your medication summary on the “After Visit Summary” that the nursing staff will review with you prior to discharge.  You may have some home medications which will be placed on hold until you complete the course of blood thinner medication.  It is important for you to complete the blood thinner medication as prescribed by your surgeon.  Continue your approved medications as instructed at time of discharge. ° °PRECAUTIONS °If you experience chest pain or shortness of breath - call 911 immediately for transfer to the hospital emergency department.  °If you develop a fever greater that 101 F,  purulent drainage from wound, increased redness or drainage from wound, foul odor from the wound/dressing, or calf pain - CONTACT YOUR SURGEON.   °                                                °FOLLOW-UP APPOINTMENTS °Make sure you keep all of your appointments after your operation with your surgeon and caregivers. You should call the office at the above phone number and make an appointment for approximately two weeks after the date of your surgery or on the date instructed by your surgeon outlined in the "After Visit Summary". ° °RANGE OF MOTION AND STRENGTHENING EXERCISES  °These exercises are designed to help you keep full movement of your hip joint. Follow your caregiver's or physical therapist's instructions. Perform all exercises about fifteen times, three times per day or as directed. Exercise both hips, even if you   have had only one joint replacement. These exercises can be done on a training (exercise) mat, on the floor, on a table or on a bed. Use whatever works the best and is most comfortable for you. Use music or television while you are exercising so that the exercises are a pleasant break in your day. This will make your life better with the exercises acting as a break in routine you can look forward to.  °Lying on your back, slowly slide your foot toward your buttocks, raising your knee up off the floor. Then slowly slide your foot back down until your leg is straight again.  °Lying on your back spread your legs as far apart as you can without causing discomfort.  °Lying on your side, raise your upper leg and foot straight up from the floor as far as is comfortable. Slowly lower the leg and repeat.  °Lying on your back, tighten up the muscle in the front of your thigh (quadriceps muscles). You can do this by keeping your leg straight and trying to raise your heel off the floor. This helps strengthen the largest muscle supporting your knee.  °Lying on your back, tighten up the muscles of your  buttocks both with the legs straight and with the knee bent at a comfortable angle while keeping your heel on the floor.  ° °IF YOU ARE TRANSFERRED TO A SKILLED REHAB FACILITY °If the patient is transferred to a skilled rehab facility following release from the hospital, a list of the current medications will be sent to the facility for the patient to continue.  When discharged from the skilled rehab facility, please have the facility set up the patient's Home Health Physical Therapy prior to being released. Also, the skilled facility will be responsible for providing the patient with their medications at time of release from the facility to include their pain medication, the muscle relaxants, and their blood thinner medication. If the patient is still at the rehab facility at time of the two week follow up appointment, the skilled rehab facility will also need to assist the patient in arranging follow up appointment in our office and any transportation needs. ° °MAKE SURE YOU:  °Understand these instructions.  °Get help right away if you are not doing well or get worse.  ° ° °Pick up stool softner and laxative for home use following surgery while on pain medications. °Do not submerge incision under water. °Please use good hand washing techniques while changing dressing each day. °May shower starting three days after surgery. °Please use a clean towel to pat the incision dry following showers. °Continue to use ice for pain and swelling after surgery. °Do not use any lotions or creams on the incision until instructed by your surgeon. ° °Take Xarelto for two and a half more weeks following discharge from the hospital, then discontinue Xarelto. °Once the patient has completed the blood thinner regimen, then take a Baby 81 mg Aspirin daily for three more weeks. ° ° ° ° °Information on my medicine - XARELTO® (Rivaroxaban) ° °Why was Xarelto® prescribed for you? °Xarelto® was prescribed for you to reduce the risk of blood  clots forming after orthopedic surgery. The medical term for these abnormal blood clots is venous thromboembolism (VTE). ° °What do you need to know about xarelto® ? °Take your Xarelto® ONCE DAILY at the same time every day. °You may take it either with or without food. ° °If you have difficulty swallowing the tablet whole, you may   crush it and mix in applesauce just prior to taking your dose. ° °Take Xarelto® exactly as prescribed by your doctor and DO NOT stop taking Xarelto® without talking to the doctor who prescribed the medication.  Stopping without other VTE prevention medication to take the place of Xarelto® may increase your risk of developing a clot. ° °After discharge, you should have regular check-up appointments with your healthcare provider that is prescribing your Xarelto®.   ° °What do you do if you miss a dose? °If you miss a dose, take it as soon as you remember on the same day then continue your regularly scheduled once daily regimen the next day. Do not take two doses of Xarelto® on the same day.  ° °Important Safety Information °A possible side effect of Xarelto® is bleeding. You should call your healthcare provider right away if you experience any of the following: °? Bleeding from an injury or your nose that does not stop. °? Unusual colored urine (red or dark brown) or unusual colored stools (red or black). °? Unusual bruising for unknown reasons. °? A serious fall or if you hit your head (even if there is no bleeding). ° °Some medicines may interact with Xarelto® and might increase your risk of bleeding while on Xarelto®. To help avoid this, consult your healthcare provider or pharmacist prior to using any new prescription or non-prescription medications, including herbals, vitamins, non-steroidal anti-inflammatory drugs (NSAIDs) and supplements. ° °This website has more information on Xarelto®: [email or web address].com. ° ° ° °

## 2017-04-25 NOTE — Anesthesia Preprocedure Evaluation (Signed)
Anesthesia Evaluation  Patient identified by MRN, date of birth, ID band Patient awake    Reviewed: Allergy & Precautions, NPO status , Patient's Chart, lab work & pertinent test results  Airway Mallampati: II  TM Distance: >3 FB Neck ROM: Full    Dental no notable dental hx.    Pulmonary neg pulmonary ROS, sleep apnea ,    Pulmonary exam normal breath sounds clear to auscultation       Cardiovascular hypertension, negative cardio ROS Normal cardiovascular exam Rhythm:Regular Rate:Normal     Neuro/Psych  Headaches, negative neurological ROS  negative psych ROS   GI/Hepatic negative GI ROS, Neg liver ROS, GERD  ,  Endo/Other  negative endocrine ROS  Renal/GU negative Renal ROS  negative genitourinary   Musculoskeletal negative musculoskeletal ROS (+)   Abdominal (+) + obese,   Peds negative pediatric ROS (+)  Hematology negative hematology ROS (+)   Anesthesia Other Findings   Reproductive/Obstetrics negative OB ROS                             Anesthesia Physical Anesthesia Plan  ASA: II  Anesthesia Plan: MAC and Spinal   Post-op Pain Management:    Induction: Intravenous  PONV Risk Score and Plan: 2 and Ondansetron, Dexamethasone and Treatment may vary due to age or medical condition  Airway Management Planned: Simple Face Mask  Additional Equipment:   Intra-op Plan:   Post-operative Plan:   Informed Consent: I have reviewed the patients History and Physical, chart, labs and discussed the procedure including the risks, benefits and alternatives for the proposed anesthesia with the patient or authorized representative who has indicated his/her understanding and acceptance.   Dental advisory given  Plan Discussed with: CRNA  Anesthesia Plan Comments:         Anesthesia Quick Evaluation

## 2017-04-26 ENCOUNTER — Encounter (HOSPITAL_COMMUNITY): Payer: Self-pay | Admitting: Orthopedic Surgery

## 2017-04-26 LAB — BASIC METABOLIC PANEL
ANION GAP: 10 (ref 5–15)
BUN: 13 mg/dL (ref 6–20)
CALCIUM: 8.8 mg/dL — AB (ref 8.9–10.3)
CO2: 25 mmol/L (ref 22–32)
Chloride: 104 mmol/L (ref 101–111)
Creatinine, Ser: 0.7 mg/dL (ref 0.44–1.00)
GFR calc non Af Amer: >60 mL/min (ref >60)
GLUCOSE: 122 mg/dL — AB (ref 65–99)
POTASSIUM: 4.5 mmol/L (ref 3.5–5.1)
SODIUM: 139 mmol/L (ref 135–145)

## 2017-04-26 LAB — CBC
HCT: 37.8 % (ref 36.0–46.0)
Hemoglobin: 12.3 g/dL (ref 12.0–15.0)
MCH: 30.2 pg (ref 26.0–34.0)
MCHC: 32.5 g/dL (ref 30.0–36.0)
MCV: 92.9 fL (ref 78.0–100.0)
PLATELETS: 211 10*3/uL (ref 150–400)
RBC: 4.07 MIL/uL (ref 3.87–5.11)
RDW: 13 % (ref 11.5–15.5)
WBC: 15.8 10*3/uL — AB (ref 4.0–10.5)

## 2017-04-26 MED ORDER — TRAMADOL HCL 50 MG PO TABS: 50.0000 mg | ORAL_TABLET | Freq: Four times a day (QID) | ORAL | 0 refills | Status: DC | PRN

## 2017-04-26 MED ORDER — RIVAROXABAN 10 MG PO TABS: 10.0000 mg | ORAL_TABLET | Freq: Every day | ORAL | 0 refills | Status: DC

## 2017-04-26 MED ORDER — OXYCODONE HCL 5 MG PO TABS: 5.0000 mg | ORAL_TABLET | ORAL | 0 refills | Status: DC | PRN

## 2017-04-26 MED ORDER — METHOCARBAMOL 500 MG PO TABS: 500.0000 mg | ORAL_TABLET | Freq: Four times a day (QID) | ORAL | 0 refills | Status: DC | PRN

## 2017-04-26 MED ORDER — SODIUM CHLORIDE 0.9 % IV BOLUS (SEPSIS)
250.0000 mL | Freq: Once | INTRAVENOUS | Status: AC
  Administered 2017-04-26: 09:00:00 via INTRAVENOUS

## 2017-04-26 NOTE — Progress Notes (Addendum)
Pt plan to discharge home with [facility], referral given to in house rep. No DME needed.

## 2017-04-26 NOTE — Discharge Summary (Signed)
Physician Discharge Summary   Patient ID: Jill Warren MRN: [id number] DOB/AGE: 62-Jul-1957 62 y.o.  Admit date: 04/25/2017 Discharge date: 04/26/2017  Primary Diagnosis:  Osteoarthritis of the Left hip.   Admission Diagnoses:  Past Medical History:  Diagnosis Date  . Cancer of small intestine ([place]) 2002  . Headache 2013   headaches DAILY X 4 YRS  . High cholesterol   . History of home oxygen therapy    PT NOT USE HOW MUCH   . Hypertension   . Melanoma ([place]) 1998   Skin started  . Other specified disorders of tendon, left ankle and foot    ruptured 2014   Discharge Diagnoses:   Principal Problem:   OA (osteoarthritis) of hip  Estimated body mass index is 34.16 kg/m as calculated from the following:   Height as of this encounter: '5\' 4"'  (1.626 m).   Weight as of this encounter: 90.3 kg (199 lb).  Procedure(s) (LRB): LEFT TOTAL HIP ARTHROPLASTY ANTERIOR APPROACH (Left)   Consults: None  HPI: Jill Warren is a 61 y.o. female who has advanced end-  stage arthritis of their Left  hip with progressively worsening pain and  dysfunction.The patient has failed nonoperative management and presents for  total hip arthroplasty.   Laboratory Data: Admission on 04/25/2017  Component Date Value Ref Range Status  . WBC 04/26/2017 15.8* 4.0 - 10.5 K/uL Final  . RBC 04/26/2017 4.07  3.87 - 5.11 MIL/uL Final  . Hemoglobin 04/26/2017 12.3  12.0 - 15.0 g/dL Final  . HCT 04/26/2017 37.8  36.0 - 46.0 % Final  . MCV 04/26/2017 92.9  78.0 - 100.0 fL Final  . MCH 04/26/2017 30.2  26.0 - 34.0 pg Final  . MCHC 04/26/2017 32.5  30.0 - 36.0 g/dL Final  . RDW 04/26/2017 13.0  11.5 - 15.5 % Final  . Platelets 04/26/2017 211  150 - 400 K/uL Final  . Sodium 04/26/2017 139  135 - 145 mmol/L Final  . Potassium 04/26/2017 4.5  3.5 - 5.1 mmol/L Final  . Chloride 04/26/2017 104  101 - 111 mmol/L Final  . CO2 04/26/2017 25  22 - 32 mmol/L Final  . Glucose, Bld 04/26/2017 122* 65 - 99  mg/dL Final  . BUN 04/26/2017 13  6 - 20 mg/dL Final  . Creatinine, Ser 04/26/2017 0.70  0.44 - 1.00 mg/dL Final  . Calcium 04/26/2017 8.8* 8.9 - 10.3 mg/dL Final  . GFR calc non Af Amer 04/26/2017 >60  >60 mL/min Final  . GFR calc Af Amer 04/26/2017 >60  >60 mL/min Final   Comment: (NOTE) The eGFR has been calculated using the CKD EPI equation. This calculation has not been validated in all clinical situations. eGFR's persistently <60 mL/min signify possible Chronic Kidney Disease.   Georgiann Hahn gap 04/26/2017 10  5 - 15 Final  Hospital Outpatient Visit on 04/20/2017  Component Date Value Ref Range Status  . aPTT 04/20/2017 27  24 - 36 seconds Final  . WBC 04/20/2017 5.9  4.0 - 10.5 K/uL Final  . RBC 04/20/2017 4.43  3.87 - 5.11 MIL/uL Final  . Hemoglobin 04/20/2017 13.6  12.0 - 15.0 g/dL Final  . HCT 04/20/2017 40.8  36.0 - 46.0 % Final  . MCV 04/20/2017 92.1  78.0 - 100.0 fL Final  . MCH 04/20/2017 30.7  26.0 - 34.0 pg Final  . MCHC 04/20/2017 33.3  30.0 - 36.0 g/dL Final  . RDW 04/20/2017 12.7  11.5 - 15.5 % Final  .  Platelets 04/20/2017 222  150 - 400 K/uL Final  . Sodium 04/20/2017 141  135 - 145 mmol/L Final  . Potassium 04/20/2017 4.0  3.5 - 5.1 mmol/L Final  . Chloride 04/20/2017 105  101 - 111 mmol/L Final  . CO2 04/20/2017 29  22 - 32 mmol/L Final  . Glucose, Bld 04/20/2017 97  65 - 99 mg/dL Final  . BUN 04/20/2017 14  6 - 20 mg/dL Final  . Creatinine, Ser 04/20/2017 0.81  0.44 - 1.00 mg/dL Final  . Calcium 04/20/2017 9.2  8.9 - 10.3 mg/dL Final  . Total Protein 04/20/2017 7.3  6.5 - 8.1 g/dL Final  . Albumin 04/20/2017 4.4  3.5 - 5.0 g/dL Final  . AST 04/20/2017 18  15 - 41 U/L Final  . ALT 04/20/2017 16  14 - 54 U/L Final  . Alkaline Phosphatase 04/20/2017 42  38 - 126 U/L Final  . Total Bilirubin 04/20/2017 0.7  0.3 - 1.2 mg/dL Final  . GFR calc non Af Amer 04/20/2017 >60  >60 mL/min Final  . GFR calc Af Amer 04/20/2017 >60  >60 mL/min Final   Comment: (NOTE) The  eGFR has been calculated using the CKD EPI equation. This calculation has not been validated in all clinical situations. eGFR's persistently <60 mL/min signify possible Chronic Kidney Disease.   . Anion gap 04/20/2017 7  5 - 15 Final  . Prothrombin Time 04/20/2017 12.7  11.4 - 15.2 seconds Final  . INR 04/20/2017 0.95   Final  . ABO/RH(D) 04/20/2017 A POS   Final  . Antibody Screen 04/20/2017 NEG   Final  . Sample Expiration 04/20/2017 04/28/2017   Final  . Extend sample reason 04/20/2017 NO TRANSFUSIONS OR PREGNANCY IN THE PAST 3 MONTHS   Final  . MRSA, PCR 04/20/2017 NEGATIVE  NEGATIVE Final  . Staphylococcus aureus 04/20/2017 NEGATIVE  NEGATIVE Final   Comment:        The Xpert SA Assay (FDA approved for NASAL specimens in patients over 63 years of age), is one component of a comprehensive surveillance program.  Test performance has been validated by [facility] for patients greater than or equal to 58 year old. It is not intended to diagnose infection nor to guide or monitor treatment.   . ABO/RH(D) 04/20/2017 A POS   Final     X-Rays:Dg Pelvis Portable  Result Date: 04/25/2017 CLINICAL DATA:  Post op left total hip, anterior approach EXAM: PORTABLE PELVIS 1-2 VIEWS COMPARISON:  None. FINDINGS: Single AP view of the pelvis is provided showing a total left hip arthroplasty which appears appropriately positioned. Osseous alignment is anatomic. Drainage catheter in place. Expected postsurgical changes within the surrounding soft tissues. IMPRESSION: Status post total left hip arthroplasty. Hardware appears intact and appropriately positioned. No evidence of surgical complicating feature. Electronically Signed   By: Franki Cabot M.D.   On: 04/25/2017 10:27   Dg C-arm 1-60 Min-no Report  Result Date: 04/25/2017 Fluoroscopy was utilized by the requesting physician.  No radiographic interpretation.    EKG: Orders placed or performed during the hospital encounter of 04/20/17  .  EKG 12 lead  . EKG 12 lead     Hospital Course: Patient was admitted to [facility] and taken to the OR and underwent the above state procedure without complications.  Patient tolerated the procedure well and was later transferred to the recovery room and then to the orthopaedic floor for postoperative care.  They were given PO and IV analgesics for  pain control following their surgery.  They were given 24 hours of postoperative antibiotics of  Anti-infectives    Start     Dose/Rate Route Frequency Ordered Stop   04/25/17 1500  ceFAZolin (ANCEF) IVPB 2g/100 mL premix     2 g 200 mL/hr over 30 Minutes Intravenous Every 6 hours 04/25/17 1238 04/25/17 2111   04/25/17 0618  vancomycin (VANCOCIN) 1-5 GM/200ML-% IVPB  Status:  Discontinued    Comments:  [place] Session   : cabinet override      04/25/17 0618 04/25/17 0639   04/25/17 0617  ceFAZolin (ANCEF) 2-4 GM/100ML-% IVPB    Comments:  [place] Session   : cabinet override      04/25/17 0617 04/25/17 0837   04/25/17 0616  ceFAZolin (ANCEF) IVPB 2g/100 mL premix     2 g 200 mL/hr over 30 Minutes Intravenous On call to O.R. 04/25/17 0254 04/25/17 0907     and started on DVT prophylaxis in the form of Xarelto.   PT and OT were ordered for total hip protocol.  The patient was allowed to be WBAT with therapy. Discharge planning was consulted to help with postop disposition and equipment needs.  Patient had a good night on the evening of surgery.  They started to get up OOB with therapy on day one.  Hemovac drain was pulled without difficulty.    Patient was seen in rounds on POD 1 and it was felt that she would be ready to go home later that afternoon.   Diet - Cardiac diet Follow up - in 2 weeks Activity - WBAT Disposition - Home Condition Upon Discharge - stable D/C Meds - See DC Summary DVT Prophylaxis - Xarelto  Discharge Instructions    Call MD / Call 911    Complete by:  As directed    If you experience chest pain or  shortness of breath, CALL 911 and be transported to the hospital emergency room.  If you develope a fever above 101 F, pus (white drainage) or increased drainage or redness at the wound, or calf pain, call your surgeon's office.   Change dressing    Complete by:  As directed    You may change your dressing dressing daily with sterile 4 x 4 inch gauze dressing and paper tape.  Do not submerge the incision under water.   Constipation Prevention    Complete by:  As directed    Drink plenty of fluids.  Prune juice may be helpful.  You may use a stool softener, such as Colace (over the counter) 100 mg twice a day.  Use MiraLax (over the counter) for constipation as needed.   Diet - low sodium heart healthy    Complete by:  As directed    Discharge instructions    Complete by:  As directed    Take Xarelto for two and a half more weeks, then discontinue Xarelto. Once the patient has completed the Xarelto, they may resume the 81 mg Aspirin.   Pick up stool softner and laxative for home use following surgery while on pain medications. Do not submerge incision under water. Please use good hand washing techniques while changing dressing each day. May shower starting three days after surgery. Please use a clean towel to pat the incision dry following showers. Continue to use ice for pain and swelling after surgery. Do not use any lotions or creams on the incision until instructed by your surgeon.  Wear both TED hose on both legs  during the day every day for three weeks, but may remove the TED hose at night at home.  Postoperative Constipation Protocol  Constipation - defined medically as fewer than three stools per week and severe constipation as less than one stool per week.  One of the most common issues patients have following surgery is constipation.  Even if you have a regular bowel pattern at home, your normal regimen is likely to be disrupted due to multiple reasons following surgery.   Combination of anesthesia, postoperative narcotics, change in appetite and fluid intake all can affect your bowels.  In order to avoid complications following surgery, here are some recommendations in order to help you during your recovery period.  Colace (docusate) - Pick up an over-the-counter form of Colace or another stool softener and take twice a day as long as you are requiring postoperative pain medications.  Take with a full glass of water daily.  If you experience loose stools or diarrhea, hold the colace until you stool forms back up.  If your symptoms do not get better within 1 week or if they get worse, check with your doctor.  Dulcolax (bisacodyl) - Pick up over-the-counter and take as directed by the product packaging as needed to assist with the movement of your bowels.  Take with a full glass of water.  Use this product as needed if not relieved by Colace only.   MiraLax (polyethylene glycol) - Pick up over-the-counter to have on hand.  MiraLax is a solution that will increase the amount of water in your bowels to assist with bowel movements.  Take as directed and can mix with a glass of water, juice, soda, coffee, or tea.  Take if you go more than two days without a movement. Do not use MiraLax more than once per day. Call your doctor if you are still constipated or irregular after using this medication for 7 days in a row.  If you continue to have problems with postoperative constipation, please contact the office for further assistance and recommendations.  If you experience "the worst abdominal pain ever" or develop nausea or vomiting, please contact the office immediatly for further recommendations for treatment.   Do not sit on low chairs, stoools or toilet seats, as it may be difficult to get up from low surfaces    Complete by:  As directed    Driving restrictions    Complete by:  As directed    No driving until released by the physician.   Increase activity slowly as  tolerated    Complete by:  As directed    Lifting restrictions    Complete by:  As directed    No lifting until released by the physician.   Patient may shower    Complete by:  As directed    You may shower without a dressing once there is no drainage.  Do not wash over the wound.  If drainage remains, do not shower until drainage stops.   TED hose    Complete by:  As directed    Use stockings (TED hose) for 3 weeks on both leg(s).  You may remove them at night for sleeping.   Weight bearing as tolerated    Complete by:  As directed    Laterality:  left   Extremity:  Lower     Allergies as of 04/26/2017   No Known Allergies     Medication List    TAKE these medications   ALIGN 4  MG Caps Take 4 mg by mouth daily as needed (stomach health).   BEPREVE 1.5 % Soln Generic drug:  Bepotastine Besilate Place 1 drop into both eyes daily as needed (irritation).   losartan 50 MG tablet Commonly known as:  COZAAR Take 50 mg by mouth at bedtime.   lovastatin 40 MG tablet Commonly known as:  MEVACOR Take 40 mg by mouth 3 (three) times a week.   methocarbamol 500 MG tablet Commonly known as:  ROBAXIN Take 1 tablet (500 mg total) by mouth every 6 (six) hours as needed for muscle spasms.   oxyCODONE 5 MG immediate release tablet Commonly known as:  Oxy IR/ROXICODONE Take 1-2 tablets (5-10 mg total) by mouth every 4 (four) hours as needed for moderate pain or severe pain.   rivaroxaban 10 MG Tabs tablet Commonly known as:  XARELTO Take 1 tablet (10 mg total) by mouth daily with breakfast. Take Xarelto for two and a half more weeks following discharge from the hospital, then discontinue Xarelto. Once the patient has completed the blood thinner regimen, then take a Baby 81 mg Aspirin daily for three more weeks. Start taking on:  04/27/2017   traMADol 50 MG tablet Commonly known as:  ULTRAM Take 1-2 tablets (50-100 mg total) by mouth every 6 (six) hours as needed for moderate pain.     triazolam 0.25 MG tablet Commonly known as:  HALCION Take 0.0625-0.125 mg by mouth at bedtime. 1/2 tab daily      Follow-up Information    Gaynelle Arabian, MD. Schedule an appointment as soon as possible for a visit on 05/08/2017.   Specialty:  Orthopedic Surgery Contact information: [address] [place] [address] [phone]           Signed: Arlee Muslim, PA-C Orthopaedic Surgery 04/26/2017, 8:49 AM

## 2017-04-26 NOTE — Progress Notes (Signed)
Physical Therapy Treatment Patient Details Name: Jill Warren MRN: [id number] DOB: August 02, 1956 Today's Date: 04/26/2017    History of Present Illness 61 yo female s/p L THA-direct anterior 04/28/17. Hx of spinal stenosis.     PT Comments    Pt able to increase ambulation distance and with improved pattern.  Her husband reports he will be using a w/c to push her into house via ramp.  Recommend HHPT.   Follow Up Recommendations  Home health PT     Equipment Recommendations  None recommended by PT    Recommendations for Other Services       Precautions / Restrictions Precautions Precautions: Fall Restrictions Weight Bearing Restrictions: No LLE Weight Bearing: Weight bearing as tolerated    Mobility  Bed Mobility Overal bed mobility: Needs Assistance Bed Mobility: Supine to Sit     Supine to sit: Min assist     General bed mobility comments: Pt able to move L LE with A from R LE, and needed MIN A for trunk with bed flat  Transfers Overall transfer level: Needs assistance Equipment used: Rolling walker (2 wheeled) Transfers: Sit to/from Stand Sit to Stand: Min guard         General transfer comment: min/guard for steadying  Ambulation/Gait Ambulation/Gait assistance: Min guard Ambulation Distance (Feet): 90 Feet Assistive device: Rolling walker (2 wheeled) Gait Pattern/deviations: Step-through pattern Gait velocity: decreased   General Gait Details: Pt with improved sequencing with step through pattern with gait .   Stairs            Wheelchair Mobility    Modified Rankin (Stroke Patients Only)       Balance Overall balance assessment: Needs assistance         Standing balance support: Bilateral upper extremity supported Standing balance-Leahy Scale: Poor Standing balance comment: requires RW for support                            Cognition Arousal/Alertness: Awake/alert Behavior During Therapy: WFL for tasks  assessed/performed Overall Cognitive Status: Within Functional Limits for tasks assessed                                        Exercises Total Joint Exercises Ankle Circles/Pumps: AROM;Both;10 reps;Supine Quad Sets: Left;10 reps;Supine Heel Slides: AROM;Left;10 reps;Supine Hip ABduction/ADduction: AAROM;Left;10 reps;Supine    General Comments        Pertinent Vitals/Pain Pain Assessment: Faces Faces Pain Scale: Hurts little more Pain Location: L hip with activity Pain Descriptors / Indicators: Grimacing;Sore;Operative site guarding Pain Intervention(s): Limited activity within patient's tolerance;Monitored during session;Repositioned;Ice applied    Home Living                      Prior Function            PT Goals (current goals can now be found in the care plan section) Acute Rehab PT Goals Patient Stated Goal: none stated PT Goal Formulation: With patient/family Time For Goal Achievement: 05/09/17 Potential to Achieve Goals: Good Progress towards PT goals: Progressing toward goals    Frequency    7X/week      PT Plan Current plan remains appropriate    Co-evaluation              AM-PAC PT "6 Clicks" Daily Activity  Outcome Measure  Difficulty turning  over in bed (including adjusting bedclothes, sheets and blankets)?: Total Difficulty moving from lying on back to sitting on the side of the bed? : Total Difficulty sitting down on and standing up from a chair with arms (e.g., wheelchair, bedside commode, etc,.)?: Total   Help needed walking in hospital room?: A Little Help needed climbing 3-5 steps with a railing? : A Little 6 Click Score: 9    End of Session Equipment Utilized During Treatment: Gait belt Activity Tolerance: Patient tolerated treatment well Patient left: in chair;with call bell/phone within reach;with family/visitor present Nurse Communication: Mobility status PT Visit Diagnosis: Muscle weakness  (generalized) (M62.81);Difficulty in walking, not elsewhere classified (R26.2)     Time: 2202-5427 PT Time Calculation (min) (ACUTE ONLY): 28 min  Charges:  $Gait Training: 8-22 mins $Therapeutic Exercise: 8-22 mins                    G Codes:       Jill Warren, [place] Pager [phone] 04/26/2017    Galen Manila 04/26/2017, 11:32 AM

## 2017-04-26 NOTE — Progress Notes (Signed)
   Subjective: 1 Day Post-Op Procedure(s) (LRB): LEFT TOTAL HIP ARTHROPLASTY ANTERIOR APPROACH (Left) Patient reports pain as mild.   Patient seen in rounds with Dr. Wynelle Link. Patient is well, but has had some minor complaints of pain in the hip, requiring pain medications We will resume therapy today. She walked about 50 feet yesterday, day of surgery.  If they do well with therapy and meets all goals, then will allow home later this afternoon following therapy. Plan is to go Home after hospital stay.  Objective: Vital signs in last 24 hours: Temp:  [97.3 F (36.3 C)-99.5 F (37.5 C)] 97.7 F (36.5 C) (06/14 0507) Pulse Rate:  [54-108] 66 (06/14 0507) Resp:  [12-21] 15 (06/14 0507) BP: (95-137)/(47-76) 95/56 (06/14 0507) SpO2:  [97 %-100 %] 99 % (06/14 0507)  Intake/Output from previous day:  Intake/Output Summary (Last 24 hours) at 04/26/17 0841 Last data filed at 04/26/17 0600  Gross per 24 hour  Intake          3866.25 ml  Output             3900 ml  Net           -33.75 ml    Intake/Output this shift: No intake/output data recorded.  Labs:  Recent Labs  04/26/17 0410  HGB 12.3    Recent Labs  04/26/17 0410  WBC 15.8*  RBC 4.07  HCT 37.8  PLT 211    Recent Labs  04/26/17 0410  NA 139  K 4.5  CL 104  CO2 25  BUN 13  CREATININE 0.70  GLUCOSE 122*  CALCIUM 8.8*   No results for input(s): LABPT, INR in the last 72 hours.  EXAM General - Patient is Alert, Appropriate and Oriented Extremity - Neurovascular intact Sensation intact distally Intact pulses distally Dorsiflexion/Plantar flexion intact Dressing - dressing C/D/I Motor Function - intact, moving foot and toes well on exam.  Hemovac pulled without difficulty.  Past Medical History:  Diagnosis Date  . Cancer of small intestine ([place]) 2002  . Headache 2013   headaches DAILY X 4 YRS  . High cholesterol   . History of home oxygen therapy    PT NOT USE HOW MUCH   . Hypertension   .  Melanoma ([place]) 1998   Skin started  . Other specified disorders of tendon, left ankle and foot    ruptured 2014    Assessment/Plan: 1 Day Post-Op Procedure(s) (LRB): LEFT TOTAL HIP ARTHROPLASTY ANTERIOR APPROACH (Left) Principal Problem:   OA (osteoarthritis) of hip  Estimated body mass index is 34.16 kg/m as calculated from the following:   Height as of this encounter: 5\' 4"  (1.626 m).   Weight as of this encounter: 90.3 kg (199 lb). Up with therapy Discharge home with home health  DVT Prophylaxis - Xarelto Weight Bearing As Tolerated left Leg Hemovac Pulled Begin Therapy  If meets goals and able to go home: Up with therapy Diet - Cardiac diet Follow up - in 2 weeks Activity - WBAT Disposition - Home Condition Upon Discharge - pending D/C Meds - See DC Summary DVT Prophylaxis - Xarelto  Arlee Muslim, PA-C Orthopaedic Surgery 04/26/2017, 8:41 AM

## 2017-05-02 ENCOUNTER — Ambulatory Visit: Payer: 59 | Admitting: Adult Health

## 2017-05-09 ENCOUNTER — Other Ambulatory Visit: Payer: Self-pay | Admitting: *Deleted

## 2017-05-09 NOTE — Patient Outreach (Signed)
[facility]) Care Management  05/09/2017  Jill Warren [birth year] [id number]  Subjective: Telephone call to patient's home number, no answer, left HIPAA compliant voicemail message, and requested call back.   Objective: Per Sunny Schlein, KPN (point of care tool), and chart review, patient hospitalized 04/25/17 - 04/26/17 for Osteoarthritis of the Left hip.   Status post Left total hip arthroplasty, anterior approach on 04/25/17.  Patient also has a history of : cancer of the small intestines, hypertension, and melanoma.    Assessment: Received [employer] Transition of care referral on 05/02/17.  Transition of care follow up pending patient contact.    Plan: RNCM will call patient for 2nd telephone outreach attempt, transition of care follow up, within 10 business days if no return call.   Jill Warren, BSN, [place] [facility] Telephonic CM Phone: [phone] Fax: [phone]

## 2017-05-10 ENCOUNTER — Other Ambulatory Visit: Payer: Self-pay | Admitting: *Deleted

## 2017-05-10 ENCOUNTER — Encounter: Payer: Self-pay | Admitting: *Deleted

## 2017-05-10 ENCOUNTER — Ambulatory Visit: Payer: Self-pay | Admitting: *Deleted

## 2017-05-10 NOTE — Patient Outreach (Signed)
[place] [facility]) Care Management  05/10/2017  HENRI GUEDES 1956/07/07 [id number]   Subjective: Telephone call to patient's home number, spoke with patient, and HIPAA verified.  Discussed [facility] Care Management [employer] Transition of care follow up, patient voiced understanding, and is in agreement to follow up.   Patient states she is doing much better, taking more time to do things than it did prior to surgery,  pleased that she is able to do what she needs too,  is following activity precautions, has completed home health physical therapy, independent with home exercise program, and attended follow up appointment with surgeon.  States she is currently off from work on approved family medical leave act [employer]).    Patient states she does not have any education material, transition of care, care coordination, disease management, disease monitoring, transportation, community resource, or pharmacy needs at this time. States she is very appreciative of the follow up and is in agreement to receive [place] Management information.   Objective: Per Sunny Schlein, KPN (point of care tool), and chart review, patient hospitalized 04/25/17 - 04/26/17 for Osteoarthritis of the Left hip.   Status post Lefttotal hip arthroplasty, anterior approach on 04/25/17.  Patient also has a history of : cancer of the small intestines, hypertension, and melanoma.    Assessment: Received [employer] Transition of care referral on 05/02/17.  Transition of care follow up completed, no care management needs, and will proceed with case closure.    Plan: RNCM will send patient successful outreach letter, [facility] pamphlet, and magnet. RNCM will send case closure due to follow up completed / no care management needs request to [facility] [place] [facility].   Hamid Brookens H. Annia Friendly, BSN, [place] Management [facility] Telephonic CM Phone: [phone] Fax: [phone]

## 2017-07-12 ENCOUNTER — Encounter: Payer: Self-pay | Admitting: Adult Health

## 2017-07-12 ENCOUNTER — Ambulatory Visit (INDEPENDENT_AMBULATORY_CARE_PROVIDER_SITE_OTHER): Payer: 59 | Admitting: Adult Health

## 2017-07-12 VITALS — BP 136/86 | HR 72 | Wt 205.0 lb

## 2017-07-12 DIAGNOSIS — R51 Headache: Secondary | ICD-10-CM

## 2017-07-12 DIAGNOSIS — M542 Cervicalgia: Secondary | ICD-10-CM | POA: Diagnosis not present

## 2017-07-12 DIAGNOSIS — R519 Headache, unspecified: Secondary | ICD-10-CM

## 2017-07-12 MED ORDER — INDOMETHACIN 25 MG SUPPOSITORY: 25.0000 mg | Freq: Two times a day (BID) | RECTAL | 5 refills | Status: DC

## 2017-07-12 NOTE — Patient Instructions (Signed)
Your Plan:  Try indomethacin suppository  MRI cervical spine If your symptoms worsen or you develop new symptoms please let us know.   Thank you for coming to see [place] at [facility]. I hope we have been able to provide you high quality care today.  You may receive a patient satisfaction survey over the next few weeks. We would appreciate your feedback and comments so that we may continue to improve ourselves and the health of our patients.

## 2017-07-12 NOTE — Progress Notes (Signed)
PATIENT: Jill Warren DOB: [birth year]  REASON FOR VISIT: follow up- daily headache HISTORY FROM: patient  HISTORY OF PRESENT ILLNESS: Today 07/12/17 Jill Warren is a 61 year old female with a history of daily headaches. She returns today for follow-up. She states that she continues to get this twisting sensation on the right side of the head that will last 30-45 minutes and then resolve. This occurs approximately 3 times a day. In the past she has tried indomethacin with good benefit. It resolves her symptoms but causes stomach pain. She is also now reporting neck pain that occurs with and without the headache but pain radiates down to the right shoulder. She denies any weakness in the right arm. Denies any numbness or tingling in the arms. She reports in the past she has seen a chiropractor however they would not do any adjustments on her neck because apparently they saw something on the x-ray. I do not have access to these images. Patient returns today for an evaluation.  HISTORY 01/30/17: Jill Warren  is a 61 year old with a history of daily headache. She returns today for follow-up. She reports that she has a mild headache throughout the day. However at least 3 times a day  she will have stabbing pain in the right frontal region. She reports these events can last up to 30 minutes.. She denies any nasal congestion or lacrimation. She does have mild phonophobia but denies phonophobia. She has tried Topamax, amitriptyline, Flexeril, gabapentin, Zonegran. She states even when she cough will cause her head hurt. She returns today for an evaluation  HISTORY 08/22/16: Jill Warren a 61 y.o.[place], married femaleAnd re-seen here as a referral from Dr. Damita Lack a sleep evaluation.   In 2015 , the patient reported insomnia- and that until 2012 she actually complained about inability to stay awake,she would come home from work and literally had to fight sleepiness at the  dinner table. Sometimes she would fall asleep in situations that she didn't intend to. Then a change happened and she states that she became more insomniac by the year 2012. There have been no medical condition or anything else that she could relate to triggering this change may be menopause. What was really am important to mention is that she had and left ankle surgery in 2014 and her orthopedist gave her alprazolam to help her sleep she took this medication for about a year and it worked fine for her but she now is in the process of slowly weaning herself to lower and lower doses. She did she stated that she doesn't feel that she is worried that her mind is racing at night she is completely relaxed she is also not overtly sleeping and daytime even if she didn't get a lot of sleep at night.  Her sleep habits are as follows: The patient relies on an alarm in the morning and rises usually around 8:00. She will have a small breakfast and drinks coffee in the morning. She commutes 20 minutes by car to work. 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 She works 8 hours daily the earliest is usually 10 AM beginning, to 7 PM - but in some days she may be called in for a 1-10 shift. She yawns at work, but doesn't feel sleepy. She drinks rareley coffee in daytime. She never naps. She goes to bed at 11.30 Pm and takes her benzo at 10 Pm. She  will sleep through the night. No coturia, some snoring. Husband has confirmed this, but sleeps in a different bedroom. She wakes up restored in AM, no headches and no dry mouth.    REVIEW OF SYSTEMS: Out of a complete 14 system review of symptoms, the patient complains only of the following symptoms, and all other reviewed systems are negative.   Abdomen pain, diarrhea, headache  ALLERGIES: No Known Allergies  HOME MEDICATIONS: Outpatient Medications Prior to Visit  Medication Sig Dispense Refill  . Bepotastine  Besilate (BEPREVE) 1.5 % SOLN Place 1 drop into both eyes daily as needed (irritation).    Marland Kitchen losartan (COZAAR) 50 MG tablet Take 50 mg by mouth at bedtime.    . lovastatin (MEVACOR) 40 MG tablet Take 40 mg by mouth 3 (three) times a week.     . Probiotic Product (ALIGN) 4 MG CAPS Take 4 mg by mouth daily as needed (stomach health).    . triazolam (HALCION) 0.25 MG tablet Take 0.0625-0.125 mg by mouth at bedtime. 1/2 tab daily     . methocarbamol (ROBAXIN) 500 MG tablet Take 1 tablet (500 mg total) by mouth every 6 (six) hours as needed for muscle spasms. 80 tablet 0  . oxyCODONE (OXY IR/ROXICODONE) 5 MG immediate release tablet Take 1-2 tablets (5-10 mg total) by mouth every 4 (four) hours as needed for moderate pain or severe pain. 60 tablet 0  . rivaroxaban (XARELTO) 10 MG TABS tablet Take 1 tablet (10 mg total) by mouth daily with breakfast. Take Xarelto for two and a half more weeks following discharge from the hospital, then discontinue Xarelto. Once the patient has completed the blood thinner regimen, then take a Baby 81 mg Aspirin daily for three more weeks. 20 tablet 0  . traMADol (ULTRAM) 50 MG tablet Take 1-2 tablets (50-100 mg total) by mouth every 6 (six) hours as needed for moderate pain. 56 tablet 0   No facility-administered medications prior to visit.     PAST MEDICAL HISTORY: Past Medical History:  Diagnosis Date  . Cancer of small intestine ([place]) 2002  . Headache 2013   headaches DAILY X 4 YRS  . High cholesterol   . History of home oxygen therapy    PT NOT USE HOW MUCH   . Hypertension   . Melanoma ([place]) 1998   Skin started  . Other specified disorders of tendon, left ankle and foot    ruptured 2014    PAST SURGICAL HISTORY: Past Surgical History:  Procedure Laterality Date  . ankle tendon Left   . APPENDECTOMY     WITH MELANOMA SURGERY  . KNEE ARTHROSCOPY Left   . melanoma removal    . TOTAL HIP ARTHROPLASTY Left 04/25/2017   Procedure: LEFT TOTAL HIP  ARTHROPLASTY ANTERIOR APPROACH;  Surgeon: Gaynelle Arabian, MD;  Location: [facility];  Service: Orthopedics;  Laterality: Left;    FAMILY HISTORY: Family History  Problem Relation Age of Onset  . Stomach cancer Father   . Lymphoma Sister   . Congestive Heart Failure Unknown   . Brain cancer Paternal Uncle     SOCIAL HISTORY: Social History   Social History  . Marital status: Married    Spouse name: N/A  . Number of children: 0  . 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 Smoking status: Never Smoker  . Smokeless tobacco: Never Used  . Alcohol use 0.0 oz/week  Comment: occ  . Drug use: No  . Sexual activity: Not on file   Other Topics Concern  . Not on file   Social History Narrative   Caffeine 2 cups daily avg.      PHYSICAL EXAM  Vitals:   07/12/17 1404  BP: 136/86  Pulse: 72  Weight: 205 lb (93 kg)   Body mass index is 35.19 kg/m.  Generalized: Well developed, in no acute distress   Neurological examination  Mentation: Alert oriented to time, place, history taking. Follows all commands speech and language fluent Cranial nerve II-XII: Pupils were equal round reactive to light. Extraocular movements were full, visual field were full on confrontational test. Facial sensation and strength were normal. Uvula tongue midline. Head turning and shoulder shrug  were normal and symmetric. Motor: The motor testing reveals 5 over 5 strength of all 4 extremities. Good symmetric motor tone is noted throughout.  Sensory: Sensory testing is intact to soft touch on all 4 extremities. No evidence of extinction is noted.  Coordination: Cerebellar testing reveals good finger-nose-finger and heel-to-shin bilaterally.  Gait and station: Gait is normal. Tandem gait is normal. Romberg is negative. No drift is seen.  Reflexes: Deep tendon reflexes are symmetric and normal bilaterally.   DIAGNOSTIC DATA (LABS,  IMAGING, TESTING) - I reviewed patient records, labs, notes, testing and imaging myself where available.  Lab Results  Component Value Date   WBC 15.8 (H) 04/26/2017   HGB 12.3 04/26/2017   HCT 37.8 04/26/2017   MCV 92.9 04/26/2017   PLT 211 04/26/2017      Component Value Date/Time   NA 139 04/26/2017 0410   K 4.5 04/26/2017 0410   CL 104 04/26/2017 0410   CO2 25 04/26/2017 0410   GLUCOSE 122 (H) 04/26/2017 0410   BUN 13 04/26/2017 0410   CREATININE 0.70 04/26/2017 0410   CALCIUM 8.8 (L) 04/26/2017 0410   PROT 7.3 04/20/2017 1000   ALBUMIN 4.4 04/20/2017 1000   AST 18 04/20/2017 1000   ALT 16 04/20/2017 1000   ALKPHOS 42 04/20/2017 1000   BILITOT 0.7 04/20/2017 1000   GFRNONAA >60 04/26/2017 0410   GFRAA >60 04/26/2017 0410      ASSESSMENT AND PLAN 61 y.o. year old female  has a past medical history of Cancer of small intestine ([place]) (2002); Headache (2013); High cholesterol; History of home oxygen therapy; Hypertension; Melanoma ([place]) (1998); and Other specified disorders of tendon, left ankle and foot. here with:  1. Neck pain 2. Daily headache  In the past Dr. Brett Fairy has recommended indomethacin suppositories. The patient is amenable to trying this. I will order 25 mg twice a day. Patient is reporting new symptoms of neck pain radiating to the right shoulder. I will order an MRI of the cervical spine W/WO contrast to look for acute changes. She returns today for an evaluation.     Ward Givens, MSN, NP-C 07/12/2017, 2:20 PM [facility] [address], [place], [place] [address] [phone]

## 2017-07-13 ENCOUNTER — Telehealth: Payer: Self-pay | Admitting: Adult Health

## 2017-07-13 NOTE — Telephone Encounter (Signed)
I was unable to get the MRI Cervical spine approved on my level. The phone number for the peer to peer is [phone] and the case number is [id number].

## 2017-07-17 NOTE — Telephone Encounter (Signed)
Spoke with patient and informed her that the MRI was not approved by insurance. Advised her that Jill Warren recommended she discuss her shoulder pain with PCP and possibly MRI of shoulder could be ordered to evaluate.  Patient verbalized understanding then stated her insurance didn't approve Indomethacin. She stated she needs something for her headaches and neck/shoulder pain that won't affect her stomach. She is asking if there are any alternative medications she can try.  This RN stated would discuss with Jill Warren and call her back. She verbalized appreciation and stated she would call her PCP about shoulder pain.

## 2017-07-17 NOTE — Telephone Encounter (Signed)
[name] please advise patient that Jill Warren was unable to get the MRI approved. Since she was also having shoulder pain perhaps PCP can access and imaging of the shoulder might be necessary.

## 2017-07-18 ENCOUNTER — Telehealth: Payer: Self-pay | Admitting: *Deleted

## 2017-07-18 MED ORDER — VERAPAMIL HCL 40 MG PO TABS: 40.0000 mg | ORAL_TABLET | Freq: Two times a day (BID) | ORAL | 5 refills | Status: DC

## 2017-07-18 NOTE — Telephone Encounter (Signed)
Yes, I like that. D

## 2017-07-18 NOTE — Addendum Note (Signed)
Addended by: Trudie Buckler on: 07/18/2017 04:19 PM   Modules accepted: Orders

## 2017-07-18 NOTE — Telephone Encounter (Signed)
I called the patient. Explained that we will send in verapamil 40 mg twice a day. I will start with a low dose and if the patient is tolerating well we will increase it if needed. I reviewed the side effects of verapamil with the patient. Explained that she may want to monitor her blood pressure while on this medication. If she begins to experience hypotension she will let us know. Her MRI of the cervical spine was not approved. I advised that if she would send the x-ray report. I could potentially call and hopefully get it approved. She voiced understanding.

## 2017-07-18 NOTE — Telephone Encounter (Signed)
Dr. Brett Fairy,   I was going to try low dose verapamil for this patient. Do you agree with plan?

## 2017-07-18 NOTE — Telephone Encounter (Signed)
Initialed PA for indomethacin suppositories on CMM.

## 2017-07-24 NOTE — Telephone Encounter (Signed)
Indomethacin suppos denied. NP switched pateint to another medication, Verapamil.

## 2017-07-30 ENCOUNTER — Ambulatory Visit (INDEPENDENT_AMBULATORY_CARE_PROVIDER_SITE_OTHER): Payer: Managed Care, Other (non HMO) | Admitting: Pulmonary Disease

## 2017-07-30 ENCOUNTER — Encounter: Payer: Self-pay | Admitting: Pulmonary Disease

## 2017-07-30 VITALS — BP 124/68 | HR 80 | Ht 64.0 in | Wt 208.4 lb

## 2017-07-30 DIAGNOSIS — J9611 Chronic respiratory failure with hypoxia: Secondary | ICD-10-CM

## 2017-07-30 DIAGNOSIS — R911 Solitary pulmonary nodule: Secondary | ICD-10-CM

## 2017-07-30 NOTE — Addendum Note (Signed)
Addended by: Della Goo C on: 07/30/2017 11:13 AM   Modules accepted: Orders

## 2017-07-30 NOTE — Progress Notes (Signed)
Jill Warren    [id number]    December 24, 1955  Primary Care Physician:Paterson, Quillian Quince, MD  Referring Physician: Leanna Battles, [address] [place], [place] [address]  Chief complaint:   Follow up for nocturnal hypoxemia  HPI: Jill Warren is a 61 year old with past medical history of melanoma resection, hyperlipidemia. She was evaluated for headaches by sleep study last year which did not show any sleep apnea but prolonged desaturation to 85%. She is referred to [place] for further evaluation of any pulmonary abnormalities. She is already been started on 2 L oxygen at home and referred to the sleep clinic by her PCP, Dr. Philip Aspen.  She continues to have headaches at morning and occasionally during the daytime as well. She denies any pulmonary complaints of cough, sputum production, dyspnea, wheezing.  She said history of melanoma in the past and was followed at [facility] with follow-up CT scans. Her CT scan in 2013 did not show any pulmonary interstitial infiltrates. However she had a pulmonary nodule in the right middle lobe that needs further follow-up.  She had a follow-up CT done this year at [facility] which shows persistent of lung nodules which may be slightly bigger  Interim history: Underwent hip surgery in June 2018 with no respiratory issues. She continues to have headaches. Still on nocturnal oxygen at 2 L.  Outpatient Encounter Prescriptions as of 07/30/2017  Medication Sig  . Bepotastine Besilate (BEPREVE) 1.5 % SOLN Place 1 drop into both eyes daily as needed (irritation).  Marland Kitchen losartan (COZAAR) 50 MG tablet Take 50 mg by mouth at bedtime.  . lovastatin (MEVACOR) 40 MG tablet Take 40 mg by mouth 3 (three) times a week.   . Probiotic Product (ALIGN) 4 MG CAPS Take 4 mg by mouth daily as needed (stomach health).  . triazolam (HALCION) 0.25 MG tablet Take 0.0625-0.125 mg by mouth at bedtime. 1/2 tab daily   . verapamil (CALAN) 40 MG tablet Take 1  tablet (40 mg total) by mouth 2 (two) times daily.  . [DISCONTINUED] indomethacin (INDOCIN) 25 mg SUPP Place 1 suppository (25 mg total) rectally 2 (two) times daily. (Patient not taking: Reported on 07/30/2017)   No facility-administered encounter medications on file as of 07/30/2017.     Allergies as of 07/30/2017  . (No Known Allergies)    Past Medical History:  Diagnosis Date  . Cancer of small intestine ([place]) 2002  . Headache 2013   headaches DAILY X 4 YRS  . High cholesterol   . History of home oxygen therapy    PT NOT USE HOW MUCH   . Hypertension   . Melanoma ([place]) 1998   Skin started  . Other specified disorders of tendon, left ankle and foot    ruptured 2014    Past Surgical History:  Procedure Laterality Date  . ankle tendon Left   . APPENDECTOMY     WITH MELANOMA SURGERY  . KNEE ARTHROSCOPY Left   . melanoma removal    . TOTAL HIP ARTHROPLASTY Left 04/25/2017   Procedure: LEFT TOTAL HIP ARTHROPLASTY ANTERIOR APPROACH;  Surgeon: Gaynelle Arabian, MD;  Location: [facility];  Service: Orthopedics;  Laterality: Left;    Family History  Problem Relation Age of Onset  . Stomach cancer Father   . Lymphoma Sister   . Congestive Heart Failure Unknown   . Brain cancer Paternal Uncle     Social History   Social History  . Marital status: Married  Spouse name: N/A  . Number of children: 0  . 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 Smoking status: Never Smoker  . Smokeless tobacco: Never Used  . Alcohol use 0.0 oz/week     Comment: occ  . Drug use: No  . Sexual activity: Not on file   Other Topics Concern  . Not on file   Social History Narrative   Caffeine 2 cups daily avg.    Review of systems: Review of Systems  Constitutional: Negative for fever and chills.  HENT: Negative.   Eyes: Negative for blurred vision.  Respiratory: as per HPI  Cardiovascular: Negative for chest pain and  palpitations.  Gastrointestinal: Negative for vomiting, diarrhea, blood per rectum. Genitourinary: Negative for dysuria, urgency, frequency and hematuria.  Musculoskeletal: Negative for myalgias, back pain and joint pain.  Skin: Negative for itching and rash.  Neurological: Negative for dizziness, tremors, focal weakness, seizures and loss of consciousness.  Endo/Heme/Allergies: Negative for environmental allergies.  Psychiatric/Behavioral: Negative for depression, suicidal ideas and hallucinations.  All other systems reviewed and are negative.  Physical Exam: Blood pressure 124/68, pulse 80, height 5\' 4"  (1.626 m), weight 208 lb 6.4 oz (94.5 kg), SpO2 98 %. Gen:      No acute distress HEENT:  EOMI, sclera anicteric Neck:     No masses; no thyromegaly Lungs:    Clear to auscultation bilaterally; normal respiratory effort CV:         Regular rate and rhythm; no murmurs Abd:      + bowel sounds; soft, non-tender; no palpable masses, no distension Ext:    No edema; adequate peripheral perfusion Skin:      Warm and dry; no rash Neuro: alert and oriented x 3 Psych: normal mood and affect  Data Reviewed: Sleep study 02/20/15 AHI 1.5, RDI 1.5. The lowest oxygen desaturation was 84% with 69 minutes of desaturation between 60-90%.  CT chest 06/04/12 Right middle lobe mixed attenuation groundglass pulmonary nodule, measuring approximately 6 mm (series 3, image 142), previously 6 mm. No consolidative airspace disease. No suspicious appearing pulmonary nodules or masses. No pleural effusions.  CT chest 02/29/16 Right middle lobe nodule.Looks stable  CT 04/11/17 The previously noted nonsolid nodule within the lateral segment of the right middle lobe is seen on image 140 of series 3 and measures 8 mm in diameter. This is very faintly seen, and has an appearance suggestive of atypical adenomatous hyperplasia. Continued surveillance is suggested at 2 year intervals. No new evidence of metastatic  disease to the thorax.  Labs 5/0/35 CBC, metabolic panel within normal limits. Bicarbonate-26  ABG 02/17/16- 7.46/33/117/98  PFTs. 6/17 FVC 2.99 [91%)  FEV1 2.8 (91%) F/F 77 TLC 100%  DLCO 101%. Normal study.  Assessment:  #1 Nocturnal desaturation. Review of her blood work does not show elevated bicarbonate suggestive of chronic hypercarbia. She does not have pulmonary interstitial disease. PFTs, ABG reviewed with her which are normal. There is no evidence of OSA/OHS. She will continue the nocturnal O2. We will order a formal sleep study for better eval.   #2 Lung nodule Lung nodule has grown from 6 in 2003 to 47mm in 2018 with appearance of adenomatous hyperplasia. Recommendation is to repeat CT in 2 years. This can be done at [facility] so there can be a direct comparison to previous CT scans   Plan/Recommendations: - CT of the chest in 2 years (2020) -  Continue nocturnal O2. Repeat sleep study.  Marshell Garfinkel MD [facility] Pager [phone] 07/30/2017, 10:59 AM  CC: Leanna Battles, MD

## 2017-07-30 NOTE — Patient Instructions (Signed)
We'll schedule you for formal sleep study in the clinic to evaluate your nocturnal hypoxia Return to clinic in 6 months.

## 2017-08-28 ENCOUNTER — Telehealth: Payer: Self-pay | Admitting: Internal Medicine

## 2017-08-28 ENCOUNTER — Telehealth: Payer: Self-pay | Admitting: Pulmonary Disease

## 2017-08-28 NOTE — Telephone Encounter (Signed)
OK to order home sleep study

## 2017-08-28 NOTE — Telephone Encounter (Signed)
Spoke with patient. She asked if the denial came from [facility] or [place]. I explained that per the message, it appeared [facility]. She stated that [name] is her primary insurance and [employer] is her secondary insurance. She wants to know if we can try again with [employer].   If not, she is willing to have the home sleep study.   PCCs, please advise. Thanks!

## 2017-08-28 NOTE — Telephone Encounter (Signed)
Dr. Vaughan Browner, please advise on this staff message that was received from [facility]. Thanks!  Ottinger, Kyung Bacca, MD; Kaylean Tupou, Waldemar Dickens, CMA        [facility] denied in lab sleep study do you want to so a home study if so I need an order thanks

## 2017-08-28 NOTE — Telephone Encounter (Signed)
Error message

## 2017-08-30 ENCOUNTER — Other Ambulatory Visit: Payer: Self-pay | Admitting: *Deleted

## 2017-08-30 DIAGNOSIS — G4733 Obstructive sleep apnea (adult) (pediatric): Secondary | ICD-10-CM

## 2017-08-31 NOTE — Telephone Encounter (Signed)
I forgot that she already had a home sleep study a few years back that did not show any OSA. I was hoping that study in the lab would be more diagnostic. If the insurance denied a lab sleep study then refer back to her sleep and neuro doctor Dr. Asencion Partridge Dohmeier for further eval. Thanks  Marshell Garfinkel MD [facility] Pager [phone] 08/31/2017, 5:25 PM

## 2017-09-03 NOTE — Telephone Encounter (Signed)
ATC pt, no answer. Left message for pt to call back.   I want to make sure this is ok with pt.

## 2017-09-04 ENCOUNTER — Telehealth: Payer: Self-pay | Admitting: Pulmonary Disease

## 2017-09-04 NOTE — Telephone Encounter (Signed)
Mannam has [employer] form given on 10/18 to fill out.

## 2017-09-04 NOTE — Telephone Encounter (Signed)
Marshell Garfinkel, MD      5:23 PM  Note    I forgot that she already had a home sleep study a few years back that did not show any OSA. I was hoping that study in the lab would be more diagnostic. If the insurance denied a lab sleep study then refer back to her sleep and neuro doctor Dr. Asencion Partridge Dohmeier for further eval. Thanks  Marshell Garfinkel MD [facility] Pager [phone] 08/31/2017, 5:25 PM     Spoke with pt, she states the tests

## 2017-09-04 NOTE — Telephone Encounter (Signed)
I have checked PM's cubby and did not see form.  PM please advise if you have [employer] form. Thanks.

## 2017-09-04 NOTE — Telephone Encounter (Signed)
PCC's can we run tests through [name], this is her primary insurance. [employer] is her secondary.

## 2017-09-05 NOTE — Telephone Encounter (Addendum)
Cancel the HST order. Please back refer to Dr. Asencion Partridge Dohmeier for re evaluation. Thanks

## 2017-09-06 ENCOUNTER — Telehealth: Payer: Self-pay | Admitting: Pulmonary Disease

## 2017-09-06 NOTE — Telephone Encounter (Signed)
Called and spoke with pt and she stated that she is scheduled for split night study and she stated that she wants to make sure that we are checking with [employer] since this is her primary insurance.  Will forward this to Rodena Piety so she may follow up on this.   She wanted to make sure that EP did not have anything further to let the pt know.  I advised the pt that I will send this EP to ask about this.  Please advise. Thanks

## 2017-09-06 NOTE — Telephone Encounter (Signed)
Left message for pt to call us back. 

## 2017-09-07 NOTE — Telephone Encounter (Signed)
Please see 09/04/17 phone note.

## 2017-09-07 NOTE — Telephone Encounter (Signed)
lmtcb x2 for pt. 

## 2017-09-07 NOTE — Telephone Encounter (Signed)
I have faxed records to [employer] when I get confirmation I will call the patient and let her know what is taking place

## 2017-09-07 NOTE — Telephone Encounter (Signed)
I have spoken with Jill Warren about the Authorization is pending right now. When I hear something from the insurance company I will call her back to keep her updated. She is worried about maybe cancelling the sleep study too late since the lab has that cancellation policy now. I will call them on Monday 09/10/17 and explain what we are waiting on. Hopefully they will work with [place] on this problem

## 2017-09-12 NOTE — Telephone Encounter (Signed)
I just called [employer] again today and the case has been sent to the Medical Director this morning. The person I spoke with stated that there should be an answer this afternoon or by tomorrow morning. I have informed Mrs. Epting of where things stand right now. As soon as I get the approval or denial I will call Mrs. Kusch to inform her

## 2017-09-12 NOTE — Telephone Encounter (Signed)
Rodena Piety any updates?  Can we close this message?  Thanks

## 2017-09-13 ENCOUNTER — Encounter (HOSPITAL_BASED_OUTPATIENT_CLINIC_OR_DEPARTMENT_OTHER): Payer: Managed Care, Other (non HMO)

## 2017-09-13 NOTE — Telephone Encounter (Signed)
I have called [facility] again this morning and they have denied the split night sleep study for this patient reason there is no evidence that she has OSA. I have called the sleep lab and Jill Warren letting them both know that the sleep study has been denied and to cancel sleep study for tonight. I have sent a staff message to [facility] and Dr. Vaughan Browner making them aware the sleep study has been denied

## 2017-09-14 NOTE — Telephone Encounter (Addendum)
lmtcb x3 for pt to make pt aware of below message  Marshell Garfinkel, MD  Gurney Maxin, [name], CMA        Continue o2 at night till she can be revaluated but Dr. Brett Fairy. I would prefer her to manage the O2 as it was started by her.  Thanks

## 2017-09-21 ENCOUNTER — Telehealth: Payer: Self-pay | Admitting: Pulmonary Disease

## 2017-09-21 NOTE — Telephone Encounter (Signed)
Jill Garfinkel, MD  Gurney Maxin, [name], CMA         Continue o2 at night till she can be revaluated but Dr. Brett Fairy. I would prefer her to manage the O2 as it was started by her.  Thanks     Called pt and advised message from the provider. Pt understood and verbalized understanding. Nothing further is needed.

## 2017-09-24 NOTE — Telephone Encounter (Signed)
Error

## 2017-11-21 ENCOUNTER — Ambulatory Visit: Payer: 59 | Admitting: Neurology

## 2018-01-24 ENCOUNTER — Encounter: Payer: Self-pay | Admitting: Neurology

## 2018-01-24 ENCOUNTER — Ambulatory Visit (INDEPENDENT_AMBULATORY_CARE_PROVIDER_SITE_OTHER): Payer: 59 | Admitting: Neurology

## 2018-01-24 VITALS — BP 147/83 | HR 59 | Ht 64.0 in | Wt 216.0 lb

## 2018-01-24 DIAGNOSIS — G4734 Idiopathic sleep related nonobstructive alveolar hypoventilation: Secondary | ICD-10-CM

## 2018-01-24 DIAGNOSIS — F5104 Psychophysiologic insomnia: Secondary | ICD-10-CM | POA: Diagnosis not present

## 2018-01-24 DIAGNOSIS — R51 Headache: Secondary | ICD-10-CM | POA: Diagnosis not present

## 2018-01-24 DIAGNOSIS — R0683 Snoring: Secondary | ICD-10-CM | POA: Diagnosis not present

## 2018-01-24 DIAGNOSIS — R519 Headache, unspecified: Secondary | ICD-10-CM

## 2018-01-24 MED ORDER — INDOMETHACIN 50 MG RE SUPP: 50.0000 mg | Freq: Two times a day (BID) | RECTAL | 1 refills | Status: DC

## 2018-01-24 NOTE — Patient Instructions (Addendum)
General Headache Without Cause A headache is pain or discomfort felt around the head or neck area. The specific cause of a headache may not be found. There are many causes and types of headaches. A few common ones are:  Tension headaches.  Migraine headaches.  Cluster headaches.  Chronic daily headaches.  Follow these instructions at home: Watch your condition for any changes. Take these steps to help with your condition: Managing pain  Take over-the-counter and prescription medicines only as told by your health care provider.  Lie down in a dark, quiet room when you have a headache.  If directed, apply ice to the head and neck area: ? Put ice in a plastic bag. ? Place a towel between your skin and the bag. ? Leave the ice on for 20 minutes, 2-3 times per day.  Use a heating pad or hot shower to apply heat to the head and neck area as told by your health care provider.  Keep lights dim if bright lights bother you or make your headaches worse. Eating and drinking  Eat meals on a regular schedule.  Limit alcohol use.  Decrease the amount of caffeine you drink, or stop drinking caffeine. General instructions  Keep all follow-up visits as told by your health care provider. This is important.  Keep a headache journal to help find out what may trigger your headaches. For example, write down: ? What you eat and drink. ? How much sleep you get. ? Any change to your diet or medicines.  Try massage or other relaxation techniques.  Limit stress.  Sit up straight, and do not tense your muscles.  Do not use tobacco products, including cigarettes, chewing tobacco, or e-cigarettes. If you need help quitting, ask your health care provider.  Exercise regularly as told by your health care provider.  Sleep on a regular schedule. Get 7-9 hours of sleep, or the amount recommended by your health care provider. Contact a health care provider if:  Your symptoms are not helped by  medicine.  You have a headache that is different from the usual headache.  You have nausea or you vomit.  You have a fever. Get help right away if:  Your headache becomes severe.  You have repeated vomiting.  You have a stiff neck.  You have a loss of vision.  You have problems with speech.  You have pain in the eye or ear.  You have muscular weakness or loss of muscle control.  You lose your balance or have trouble walking.  You feel faint or pass out.  You have confusion. This information is not intended to replace advice given to you by your health care provider. Make sure you discuss any questions you have with your health care provider. Document Released: 10/30/2005 Document Revised: 04/06/2016 Document Reviewed: 02/22/2015 Elsevier Interactive Patient Education  [employer].    Since your headaches have not changed under the use of nocturnal oxygen, and during her recent surgery, you were not observed becoming hypoxic ( this based on  to your own observation and record ).  I think if you may not need to continue using oxygen.  I would appreciate if your pulmonologist would let me know if he feels the same way.

## 2018-01-24 NOTE — Progress Notes (Signed)
PATIENT: Jill Warren DOB: 1956/03/11  REASON FOR VISIT: follow up- daily headache HISTORY FROM: patient  HISTORY OF PRESENT ILLNESS: Today 01/24/18; The patient is undergone a hip surgery in June 2018, and in January of this year her husband underwent back surgery, both have been moving less, exercising less and Jill Warren has gained about 16 pounds.  She continues to present with daily headaches but found that relief gives her the best relief.  However she is taking Aleve on a daily basis which worries me.  She could develop medication overuse headaches.  A trial of treating her headaches with oxygen did not work out.  Her MRI of the brain was normal, cervical spine MRI was denied to, attended sleep study was denied. She does neither report clusters nor migraines. This is tension headaches and not lasting very long. Insomnia did not come up in today's visit.  She had some success with oral indomethacin until she developed diarrhea, and a change to suppositories was also not covered by her health insurance.  I will see today how much she would have to pay out-of-pocket should she privately purchase the medication.   MM- date ? Jill Warren is a 62 year old female with a history of daily headaches. She returns today for follow-up. She states that she continues to get this twisting sensation on the right side of the head that will last 30-45 minutes and then resolve. This occurs approximately 3 times a day. In the past she has tried indomethacin with good benefit. It resolves her symptoms but causes stomach pain. She is also now reporting neck pain that occurs with and without the headache but pain radiates down to the right shoulder. She denies any weakness in the right arm. Denies any numbness or tingling in the arms. She reports in the past she has seen a chiropractor however they would not do any adjustments on her neck because apparently they saw something on the x-ray. I do not have  access to these images. Patient returns today for an evaluation.  HISTORY 01/30/17: Jill Warren  is a 62 year old with a history of daily headache. She returns today for follow-up. She reports that she has a mild headache throughout the day. However at least 3 times a day  she will have stabbing pain in the right frontal region. She reports these events can last up to 30 minutes.. She denies any nasal congestion or lacrimation. She does have mild phonophobia but denies phonophobia. She has tried Topamax, amitriptyline, Flexeril, gabapentin, Zonegran. She states even when she cough will cause her head hurt. She returns today for an evaluation  HISTORY 08/22/16: Jill Warren a 62 y.o.[place], married femaleAnd re-seen here as a referral from Dr. Damita Lack a sleep evaluation.   In 2015 , the patient reported insomnia- and that until 2012 she actually complained about inability to stay awake,she would come home from work and literally had to fight sleepiness at the dinner table. Sometimes she would fall asleep in situations that she didn't intend to. Then a change happened and she states that she became more insomniac by the year 2012. There have been no medical condition or anything else that she could relate to triggering this change may be menopause. What was really am important to mention is that she had and left ankle surgery in 2014 and her orthopedist gave her alprazolam to help her sleep she took this medication for about a year and it worked fine for her but she  now is in the process of slowly weaning herself to lower and lower doses. She did she stated that she doesn't feel that she is worried that her mind is racing at night she is completely relaxed she is also not overtly sleeping and daytime even if she didn't get a lot of sleep at night.  Her sleep habits are as follows: The patient relies on an alarm in the morning and rises usually around 8:00. She will have a small  breakfast and drinks coffee in the morning. She commutes 20 minutes by car to work. 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 She works 8 hours daily the earliest is usually 10 AM beginning, to 7 PM - but in some days she may be called in for a 1-10 shift. She yawns at work, but doesn't feel sleepy. She drinks rareley coffee in daytime. She never naps. She goes to bed at 11.30 Pm and takes her benzo at 10 Pm. She will sleep through the night. No coturia, some snoring. Husband has confirmed this, but sleeps in a different bedroom. She wakes up restored in AM, no headches and no dry mouth.    REVIEW OF SYSTEMS: Out of a complete 14 system review of symptoms, the patient complains only of the following symptoms, and all other reviewed systems are negative.   Abdomen pain, diarrhea, headache  ALLERGIES: No Known Allergies  HOME MEDICATIONS: Outpatient Medications Prior to Visit  Medication Sig Dispense Refill  . Bepotastine Besilate (BEPREVE) 1.5 % SOLN Place 1 drop into both eyes daily as needed (irritation).    . Biotin 2500 MCG CAPS Take 1 capsule by mouth daily.    Marland Kitchen losartan (COZAAR) 50 MG tablet Take 50 mg by mouth at bedtime.    . lovastatin (MEVACOR) 40 MG tablet Take 40 mg by mouth 3 (three) times a week.     . naproxen sodium (ALEVE) 220 MG tablet Take 220 mg by mouth. Take 1-2 a day for headache    . Probiotic Product (ALIGN) 4 MG CAPS Take 4 mg by mouth daily as needed (stomach health).    . triazolam (HALCION) 0.25 MG tablet Take 0.0625-0.125 mg by mouth at bedtime. 1/2 tab daily     . verapamil (CALAN) 40 MG tablet Take 1 tablet (40 mg total) by mouth 2 (two) times daily. 60 tablet 5   No facility-administered medications prior to visit.     PAST MEDICAL HISTORY: Past Medical History:  Diagnosis Date  . Cancer of small intestine ([place]) 2002  . Headache 2013   headaches DAILY X 4 YRS  . High cholesterol   . History of  home oxygen therapy    PT NOT USE HOW MUCH   . Hypertension   . Melanoma ([place]) 1998   Skin started  . Other specified disorders of tendon, left ankle and foot    ruptured 2014    PAST SURGICAL HISTORY: Past Surgical History:  Procedure Laterality Date  . ankle tendon Left   . APPENDECTOMY     WITH MELANOMA SURGERY  . KNEE ARTHROSCOPY Left   . melanoma removal    . TOTAL HIP ARTHROPLASTY Left 04/25/2017   Procedure: LEFT TOTAL HIP ARTHROPLASTY ANTERIOR APPROACH;  Surgeon: Gaynelle Arabian, MD;  Location: [facility];  Service: Orthopedics;  Laterality: Left;    FAMILY HISTORY: Family History  Problem Relation Age of Onset  . Stomach cancer Father   .  Lymphoma Sister   . Congestive Heart Failure Unknown   . Brain cancer Paternal Uncle     SOCIAL HISTORY: Social History   Socioeconomic History  . Marital status: Married    Spouse name: Not on file  . Number of children: 0  . Years of education: HS grad  . Highest education level: Not on file  Social Needs  . Financial resource strain: Not on file  . Food insecurity - worry: Not on file  . Food insecurity - inability: Not on file  . Transportation needs - medical: Not on file  . Transportation needs - non-medical: Not on file  Occupational History  . Occupation: Public house manager: [employer]  Tobacco Use  . Smoking status: Never Smoker  . Smokeless tobacco: Never Used  Substance and Sexual Activity  . Alcohol use: Yes    Alcohol/week: 0.0 oz    Comment: occ  . Drug use: No  . Sexual activity: Not on file  Other Topics Concern  . Not on file  Social History Narrative   Caffeine 2 cups daily avg.      PHYSICAL EXAM  Vitals:   01/24/18 1054  BP: (!) 147/83  Pulse: (!) 59  Weight: 216 lb (98 kg)  Height: 5\' 4"  (1.626 m)   Body mass index is 37.08 kg/m.  Generalized: Well developed, in no acute distress   Neurological examination  Mentation: Alert oriented to time, place, history taking. Follows all  commands speech and language fluent Cranial nerve II-XII: Pupils were equal round reactive to light. Extraocular movements were full, visual field were full on confrontational test. Facial sensation and strength were normal. Uvula tongue midline. Head turning and shoulder shrug  were normal and symmetric. Motor: The motor testing reveals 5 over 5 strength of all 4 extremities. Good symmetric motor tone is noted throughout.  Sensory: Sensory testing is intact to soft touch on all 4 extremities. No evidence of extinction is noted.  Coordination: Cerebellar testing reveals good finger-nose-finger and heel-to-shin bilaterally.  Gait and station: Gait is normal. Tandem gait is normal. Romberg is negative. No drift is seen.  Reflexes: Deep tendon reflexes are symmetric and normal bilaterally.   DIAGNOSTIC DATA (LABS, IMAGING, TESTING) - I reviewed patient records, labs, notes, testing and imaging myself where available.  Lab Results  Component Value Date   WBC 15.8 (H) 04/26/2017   HGB 12.3 04/26/2017   HCT 37.8 04/26/2017   MCV 92.9 04/26/2017   PLT 211 04/26/2017      Component Value Date/Time   NA 139 04/26/2017 0410   K 4.5 04/26/2017 0410   CL 104 04/26/2017 0410   CO2 25 04/26/2017 0410   GLUCOSE 122 (H) 04/26/2017 0410   BUN 13 04/26/2017 0410   CREATININE 0.70 04/26/2017 0410   CALCIUM 8.8 (L) 04/26/2017 0410   PROT 7.3 04/20/2017 1000   ALBUMIN 4.4 04/20/2017 1000   AST 18 04/20/2017 1000   ALT 16 04/20/2017 1000   ALKPHOS 42 04/20/2017 1000   BILITOT 0.7 04/20/2017 1000   GFRNONAA >60 04/26/2017 0410   GFRAA >60 04/26/2017 0410      ASSESSMENT AND PLAN 62 y.o. year old female patient , scheduled to see pulmonology    1. Cervicalgia , tension daily headache, not migraineous., not associated with photo, phonophobia, nor nausea. Normal ahead MRI- cervical MRI was declined. 2. Not cluster HA, but daily morning headache -these continued on oxygen. She feels most relief on  aleve .  3. Insomnia, Psychological? Sleep study denied.   I,  Dr. Brett Fairy, have recommended indomethacin suppositories after oral indomethacin  had worked well but gave her diarrhea.       [employer] declined.         01/24/2018, 11:06 AM [facility] [address], [place], [place] [address] [phone]

## 2018-01-29 ENCOUNTER — Telehealth: Payer: Self-pay | Admitting: Neurology

## 2018-01-29 NOTE — Telephone Encounter (Signed)
Pa completed through cover my meds through [facility]. KEY: [id number] Should hear something within 24 hours

## 2018-01-30 NOTE — Telephone Encounter (Signed)
PA approved from 01/29/2018-01/30/2019 throuh [facility].

## 2018-02-08 DIAGNOSIS — I10 Essential (primary) hypertension: Secondary | ICD-10-CM | POA: Insufficient documentation

## 2018-02-18 ENCOUNTER — Ambulatory Visit (INDEPENDENT_AMBULATORY_CARE_PROVIDER_SITE_OTHER): Payer: Managed Care, Other (non HMO) | Admitting: Pulmonary Disease

## 2018-02-18 VITALS — BP 134/70 | HR 68 | Ht 64.0 in | Wt 213.0 lb

## 2018-02-18 DIAGNOSIS — R911 Solitary pulmonary nodule: Secondary | ICD-10-CM | POA: Diagnosis not present

## 2018-02-18 DIAGNOSIS — G4734 Idiopathic sleep related nonobstructive alveolar hypoventilation: Secondary | ICD-10-CM

## 2018-02-18 NOTE — Progress Notes (Addendum)
Jill Warren    [id number]    07-19-1956  Primary Care Physician:Paterson, Quillian Quince, MD  Referring Physician: Leanna Battles, [address] [place], [place] [address]  Chief complaint:   Follow up for nocturnal hypoxemia  HPI: Jill Warren is a 62 year old with past medical history of melanoma resection, hyperlipidemia. She was evaluated for headaches by sleep study last year which did not show any sleep apnea but prolonged desaturation to 85%. She is referred to [place] for further evaluation of any pulmonary abnormalities. She is already been started on 2 L oxygen at home and referred to the sleep clinic by her PCP, Dr. Philip Aspen.  She continues to have headaches at morning and occasionally during the daytime as well. She denies any pulmonary complaints of cough, sputum production, dyspnea, wheezing.  She said history of melanoma in the past and was followed at [facility] with follow-up CT scans. Her CT scan in 2013 did not show any pulmonary interstitial infiltrates. However she had a pulmonary nodule in the right middle lobe that needs further follow-up.  She had a follow-up CT done this year at [facility] which shows persistent of lung nodules which may be slightly bigger. Underwent hip surgery in June 2018 with no respiratory issues.  Interim history: Continues on nocturnal oxygen at 2 L.  Still has headaches.  She does not notice any improvement in headaches on the nights that she does not use supplemental oxygen Breathing issues.  States that she does not have any dyspnea, cough, sputum production.  Outpatient Encounter Medications as of 02/18/2018  Medication Sig  . Bepotastine Besilate (BEPREVE) 1.5 % SOLN Place 1 drop into both eyes daily as needed (irritation).  . Biotin 2500 MCG CAPS Take 1 capsule by mouth daily.  . indomethacin (INDOCIN) 50 MG suppository Place 1 suppository (50 mg total) rectally 2 (two) times daily.  Marland Kitchen losartan (COZAAR) 50 MG  tablet Take 50 mg by mouth at bedtime.  . lovastatin (MEVACOR) 40 MG tablet Take 40 mg by mouth 3 (three) times a week.   . naproxen sodium (ALEVE) 220 MG tablet Take 220 mg by mouth. Take 1-2 a day for headache  . Probiotic Product (ALIGN) 4 MG CAPS Take 4 mg by mouth daily as needed (stomach health).  . triazolam (HALCION) 0.25 MG tablet Take 0.0625-0.125 mg by mouth at bedtime. 1/2 tab daily    No facility-administered encounter medications on file as of 02/18/2018.     Allergies as of 02/18/2018  . (No Known Allergies)    Past Medical History:  Diagnosis Date  . Cancer of small intestine ([place]) 2002  . Headache 2013   headaches DAILY X 4 YRS  . High cholesterol   . History of home oxygen therapy    PT NOT USE HOW MUCH   . Hypertension   . Melanoma ([place]) 1998   Skin started  . Other specified disorders of tendon, left ankle and foot    ruptured 2014    Past Surgical History:  Procedure Laterality Date  . ankle tendon Left   . APPENDECTOMY     WITH MELANOMA SURGERY  . KNEE ARTHROSCOPY Left   . melanoma removal    . TOTAL HIP ARTHROPLASTY Left 04/25/2017   Procedure: LEFT TOTAL HIP ARTHROPLASTY ANTERIOR APPROACH;  Surgeon: Gaynelle Arabian, MD;  Location: [facility];  Service: Orthopedics;  Laterality: Left;    Family History  Problem Relation Age of Onset  .  Stomach cancer Father   . Lymphoma Sister   . Congestive Heart Failure Unknown   . Brain cancer Paternal Uncle     Social History   Socioeconomic History  . Marital status: Married    Spouse name: Not on file  . Number of children: 0  . Years of education: HS grad  . Highest education level: Not on file  Occupational History  . Occupation: Public house manager: [employer]  . Financial resource strain: Not on file  . Food insecurity:    Worry: Not on file    Inability: Not on file  . Transportation needs:    Medical: Not on file    Non-medical: Not on file  Tobacco Use  . Smoking status:  Never Smoker  . Smokeless tobacco: Never Used  Substance and Sexual Activity  . Alcohol use: Yes    Alcohol/week: 0.0 oz    Comment: occ  . Drug use: No  . Sexual activity: Not on file  Lifestyle  . Physical activity:    Days per week: Not on file    Minutes per session: Not on file  . Stress: Not on file  Relationships  . Social connections:    Talks on phone: Not on file    Gets together: Not on file    Attends religious service: Not on file    Active member of club or organization: Not on file    Attends meetings of clubs or organizations: Not on file    Relationship status: Not on file  . Intimate partner violence:    Fear of current or ex partner: Not on file    Emotionally abused: Not on file    Physically abused: Not on file    Forced sexual activity: Not on file  Other Topics Concern  . Not on file  Social History Narrative   Caffeine 2 cups daily avg.    Review of systems: Review of Systems  Constitutional: Negative for fever and chills.  HENT: Negative.   Eyes: Negative for blurred vision.  Respiratory: as per HPI  Cardiovascular: Negative for chest pain and palpitations.  Gastrointestinal: Negative for vomiting, diarrhea, blood per rectum. Genitourinary: Negative for dysuria, urgency, frequency and hematuria.  Musculoskeletal: Negative for myalgias, back pain and joint pain.  Skin: Negative for itching and rash.  Neurological: Negative for dizziness, tremors, focal weakness, seizures and loss of consciousness.  Endo/Heme/Allergies: Negative for environmental allergies.  Psychiatric/Behavioral: Negative for depression, suicidal ideas and hallucinations.  All other systems reviewed and are negative.  Physical Exam: Blood pressure 124/68, pulse 80, height 5\' 4"  (1.626 m), weight 208 lb 6.4 oz (94.5 kg), SpO2 98 %. Gen:      No acute distress HEENT:  EOMI, sclera anicteric Neck:     No masses; no thyromegaly Lungs:    Clear to auscultation bilaterally;  normal respiratory effort CV:         Regular rate and rhythm; no murmurs Abd:      + bowel sounds; soft, non-tender; no palpable masses, no distension Ext:    No edema; adequate peripheral perfusion Skin:      Warm and dry; no rash Neuro: alert and oriented x 3 Psych: normal mood and affect  Data Reviewed: Sleep study 02/20/15 AHI 1.5, RDI 1.5. The lowest oxygen desaturation was 84% with 69 minutes of desaturation between 60-90%.  CT chest 06/04/12 Right middle lobe mixed attenuation groundglass pulmonary nodule, measuring approximately 6 mm (  series 3, image 142), previously 6 mm. No consolidative airspace disease. No suspicious appearing pulmonary nodules or masses. No pleural effusions.  CT chest 02/29/16 Right middle lobe nodule.Looks stable  CT 04/11/17 The previously noted nonsolid nodule within the lateral segment of the right middle lobe is seen on image 140 of series 3 and measures 8 mm in diameter. This is very faintly seen, and has an appearance suggestive of atypical adenomatous hyperplasia. Continued surveillance is suggested at 2 year intervals. No new evidence of metastatic disease to the thorax.  Labs 06/20/81 CBC, metabolic panel within normal limits. Bicarbonate-26  ABG 02/17/16- 7.46/33/117/98  PFTs. 02/17/16 FVC 2.99 [91%) , FEV1 2.8 (91%), F/F 77, TLC 100% , DLCO 101%. Normal study.  Assessment:  #1 Nocturnal desaturation. Review of her blood work does not show elevated bicarbonate suggestive of chronic hypercarbia. She does not have pulmonary interstitial disease. PFTs, ABG reviewed with her which are normal. There is no evidence of OSA/OHS. She will continue the nocturnal O2.  He would like to get off oxygen at night.  We will check at home sleep study/overnight oximetry to evaluate this.  #2 Lung nodule Lung nodule has grown from 6 in 2003 to 49mm in 2018 with appearance of adenomatous hyperplasia. Recommendation is to repeat CT in 2 years. This can be done at  [facility] so there can be a direct comparison to previous CT scans  Plan/Recommendations: - CT of the chest in 2 years (2020) - Continue nocturnal O2. Repeat home sleep study/ ONO  Marshell Garfinkel MD [facility] Pager [phone] 02/18/2018, 10:26 AM  CC: Leanna Battles, MD

## 2018-02-18 NOTE — Patient Instructions (Addendum)
We will see if he can get it for a home sleep study or overnight oximetry off oxygen to reassess your oxygen needs We will order a CT chest without contrast to be done in May 2020 at [facility] where you had the previous scan Follow-up and 6 months.

## 2018-02-20 ENCOUNTER — Encounter: Payer: Self-pay | Admitting: Pulmonary Disease

## 2018-03-12 DIAGNOSIS — G4733 Obstructive sleep apnea (adult) (pediatric): Secondary | ICD-10-CM | POA: Diagnosis not present

## 2018-03-13 ENCOUNTER — Other Ambulatory Visit: Payer: Self-pay | Admitting: *Deleted

## 2018-03-13 DIAGNOSIS — G4734 Idiopathic sleep related nonobstructive alveolar hypoventilation: Secondary | ICD-10-CM

## 2018-03-13 DIAGNOSIS — G4733 Obstructive sleep apnea (adult) (pediatric): Secondary | ICD-10-CM

## 2018-03-26 ENCOUNTER — Telehealth: Payer: Self-pay

## 2018-03-26 DIAGNOSIS — G4733 Obstructive sleep apnea (adult) (pediatric): Secondary | ICD-10-CM

## 2018-03-26 NOTE — Telephone Encounter (Signed)
lmtcb x1 for pt. 

## 2018-03-26 NOTE — Telephone Encounter (Signed)
-----   Message from Marshell Garfinkel, MD sent at 03/25/2018  5:34 PM EDT ----- Please let patient know that the sleep study shows very mild OSA which likely does not need treatment with CPAP. Advise her to work on weight loss. There are no oxygen desats noted during the study. We can stop the O2 at night. Ask her to follow up in 3 months in clinic. Thanks

## 2018-03-29 NOTE — Telephone Encounter (Signed)
LMTCB x2 for pt 

## 2018-03-29 NOTE — Telephone Encounter (Signed)
Called and spoke to patient. Reviewed with her the sleep study results and no longer need for oxygen. Patient verbalized understanding. Patient stated that she uses [facility] and would like them to come pick that up. Order placed for DME to stop oxygen. Recall notice for 3 months placed. Nothing further is needed at this time.

## 2018-03-29 NOTE — Telephone Encounter (Signed)
Patient called back -she can be reached at [phone] until 1pm -pr

## 2018-05-27 ENCOUNTER — Other Ambulatory Visit: Payer: Self-pay | Admitting: Gastroenterology

## 2018-05-27 DIAGNOSIS — R1084 Generalized abdominal pain: Secondary | ICD-10-CM

## 2018-06-03 ENCOUNTER — Encounter: Payer: Self-pay | Admitting: Neurology

## 2018-06-10 ENCOUNTER — Other Ambulatory Visit: Payer: Managed Care, Other (non HMO)

## 2018-06-27 ENCOUNTER — Ambulatory Visit
Admission: RE | Admit: 2018-06-27 | Discharge: 2018-06-27 | Disposition: A | Discharge status: Home / Self Care | Payer: Managed Care, Other (non HMO) | Source: Ambulatory Visit | Attending: Gastroenterology | Admitting: Gastroenterology

## 2018-06-27 DIAGNOSIS — R1084 Generalized abdominal pain: Secondary | ICD-10-CM

## 2018-06-27 MED ORDER — IOHEXOL 300 MG/ML  SOLN
25.0000 mL | Freq: Once | INTRAMUSCULAR | Status: AC | PRN
  Administered 2018-06-27: 25 mL via INTRAVENOUS

## 2018-06-27 MED ORDER — IOPAMIDOL (ISOVUE-300) INJECTION 61%
100.0000 mL | Freq: Once | INTRAVENOUS | Status: AC | PRN
  Administered 2018-06-27: 100 mL via INTRAVENOUS

## 2018-08-26 ENCOUNTER — Ambulatory Visit: Payer: Managed Care, Other (non HMO) | Admitting: Pulmonary Disease

## 2018-08-26 ENCOUNTER — Telehealth: Payer: Self-pay | Admitting: Pulmonary Disease

## 2018-08-26 ENCOUNTER — Encounter: Payer: Self-pay | Admitting: Pulmonary Disease

## 2018-08-26 VITALS — BP 128/70 | HR 60 | Ht 64.0 in | Wt 206.4 lb

## 2018-08-26 DIAGNOSIS — R0602 Shortness of breath: Secondary | ICD-10-CM

## 2018-08-26 DIAGNOSIS — G4719 Other hypersomnia: Secondary | ICD-10-CM | POA: Diagnosis not present

## 2018-08-26 MED ORDER — FLUTICASONE FUROATE-VILANTEROL 200-25 MCG/INH IN AEPB: 1.0000 | INHALATION_SPRAY | Freq: Every day | RESPIRATORY_TRACT | 5 refills | Status: DC

## 2018-08-26 MED ORDER — PANTOPRAZOLE SODIUM 40 MG PO TBEC: 40.0000 mg | DELAYED_RELEASE_TABLET | Freq: Two times a day (BID) | ORAL | 2 refills | Status: DC

## 2018-08-26 NOTE — Telephone Encounter (Signed)
Spoke to pt, who was calling with losartan dosage, as instructed at her 08/26/18 OV. Pt states she is now taking losartan-hydrochlorothiazide 100-12.5mg  Pt's chart has been updated.  Nothing further is needed.

## 2018-08-26 NOTE — Patient Instructions (Addendum)
I am glad you are doing well with your breathing We will make sure that you have a follow-up CT scheduled for May 2020 I will see back in clinic after CT for review.

## 2018-08-26 NOTE — Progress Notes (Signed)
Jill Warren    [id number]    1956-01-27  Primary Care Physician:Paterson, Quillian Quince, MD  Referring Physician: Leanna Battles, [address] [place], [place] [address]  Chief complaint:   Follow up for nocturnal hypoxemia, lung nodule  HPI: Jill Warren is a 62 year old with past medical history of melanoma resection, hyperlipidemia. She was evaluated for headaches by sleep study last year which did not show any sleep apnea but prolonged desaturation to 85%. She is referred to [place] for further evaluation of any pulmonary abnormalities. She is already been started on 2 L oxygen at home and referred to the sleep clinic by her PCP, Dr. Philip Aspen.  She continues to have headaches at morning and occasionally during the daytime as well. She denies any pulmonary complaints of cough, sputum production, dyspnea, wheezing.  She said history of melanoma in the past and was followed at [facility] with follow-up CT scans. Her CT scan in 2013 did not show any pulmonary interstitial infiltrates. However she had a pulmonary nodule in the right middle lobe that needs further follow-up.  She had a follow-up CT done this year at [facility] which shows persistent of lung nodules which may be slightly bigger. Underwent hip surgery in June 2018 with no respiratory issues.  Interim history: Continues on nocturnal oxygen at 2 L.  Still has headaches.  She does not notice any improvement in headaches on the nights that she does not use supplemental oxygen Breathing issues.  States that she does not have any dyspnea, cough, sputum production.  Outpatient Encounter Medications as of 08/26/2018  Medication Sig  . Bepotastine Besilate (BEPREVE) 1.5 % SOLN Place 1 drop into both eyes daily as needed (irritation).  Marland Kitchen losartan (COZAAR) 50 MG tablet Take 50 mg by mouth at bedtime.  . lovastatin (MEVACOR) 40 MG tablet Take 40 mg by mouth 3 (three) times a week.   . naproxen sodium (ALEVE) 220  MG tablet Take 220 mg by mouth. Take 1-2 a day for headache  . Probiotic Product (ALIGN) 4 MG CAPS Take 4 mg by mouth daily as needed (stomach health).  . triazolam (HALCION) 0.25 MG tablet Take 0.0625-0.125 mg by mouth at bedtime. 1/2 tab daily   . [DISCONTINUED] Biotin 2500 MCG CAPS Take 1 capsule by mouth daily.  . [DISCONTINUED] indomethacin (INDOCIN) 50 MG suppository Place 1 suppository (50 mg total) rectally 2 (two) times daily.  . [DISCONTINUED] fluticasone furoate-vilanterol (BREO ELLIPTA) 200-25 MCG/INH AEPB Inhale 1 puff into the lungs daily for 1 day.  . [DISCONTINUED] pantoprazole (PROTONIX) 40 MG tablet Take 1 tablet (40 mg total) by mouth 2 (two) times daily.   No facility-administered encounter medications on file as of 08/26/2018.    Physical Exam: Blood pressure 128/70, pulse 60, height 5\' 4"  (1.626 m), weight 206 lb 6.4 oz (93.6 kg), SpO2 98 %. Gen:      No acute distress HEENT:  EOMI, sclera anicteric Neck:     No masses; no thyromegaly Lungs:    Clear to auscultation bilaterally; normal respiratory effort CV:         Regular rate and rhythm; no murmurs Abd:      + bowel sounds; soft, non-tender; no palpable masses, no distension Ext:    No edema; adequate peripheral perfusion Skin:      Warm and dry; no rash Neuro: alert and oriented x 3 Psych: normal mood and affect  Data Reviewed: Imaging CT chest 06/04/12 Right  middle lobe mixed attenuation groundglass pulmonary nodule, measuring approximately 6 mm (series 3, image 142), previously 6 mm. No consolidative airspace disease. No suspicious appearing pulmonary nodules or masses. No pleural effusions.  CT chest 02/29/16 Right middle lobe nodule.Looks stable  CT 04/11/17 The previously noted nonsolid nodule within the lateral segment of the right middle lobe is seen on image 140 of series 3 and measures 8 mm in diameter. This is very faintly seen, and has an appearance suggestive of atypical adenomatous hyperplasia.  Continued surveillance is suggested at 2 year intervals. No new evidence of metastatic disease to the thorax.  PFTs. 02/17/16 FVC 2.99 [91%) , FEV1 2.8 (91%), F/F 77, TLC 100% , DLCO 101%. Normal study.  ABG 02/17/16- 7.46/33/117/98  Sleep Sleep study 02/20/15 AHI 1.5, RDI 1.5. The lowest oxygen desaturation was 84% with 69 minutes of desaturation between 60-90%.   Home sleep study 03/12/2018- Very mild OSA, AHI 7.  Lowest O2 sat was 88%.  Labs 05/17/16 CBC, metabolic panel within normal limits. Bicarbonate-26  Assessment:  Nocturnal desaturation. Review of her blood work does not show elevated bicarbonate suggestive of chronic hypercarbia. She does not have pulmonary interstitial disease. PFTs, ABG reviewed with her which are normal. There is no evidence of OSA/OHS.  Repeat sleep study shows minimal OSA with no desats Taken off nocturnal oxygen.  Continue monitoring.  Lung nodule Lung nodule has grown from 6 in 2003 to 21mm in 2018 with appearance of adenomatous hyperplasia. Recommendation is to repeat CT in 2 years. This can be done at [facility] so there can be a direct comparison to previous CT scans  Plan/Recommendations: - CT of the chest in 2 years (2020)  Marshell Garfinkel MD [facility] 08/26/2018, 12:08 PM  CC: Leanna Battles, MD

## 2018-10-14 ENCOUNTER — Other Ambulatory Visit: Payer: Self-pay

## 2018-10-14 DIAGNOSIS — R609 Edema, unspecified: Secondary | ICD-10-CM

## 2018-10-14 DIAGNOSIS — M79605 Pain in left leg: Secondary | ICD-10-CM

## 2018-10-14 DIAGNOSIS — M79604 Pain in right leg: Secondary | ICD-10-CM

## 2018-11-10 ENCOUNTER — Other Ambulatory Visit: Payer: Self-pay | Admitting: Neurology

## 2018-12-17 ENCOUNTER — Encounter: Payer: Managed Care, Other (non HMO) | Admitting: Vascular Surgery

## 2018-12-17 ENCOUNTER — Encounter (HOSPITAL_COMMUNITY): Payer: Managed Care, Other (non HMO)

## 2019-01-21 ENCOUNTER — Ambulatory Visit (HOSPITAL_COMMUNITY)
Admission: RE | Admit: 2019-01-21 | Discharge: 2019-01-21 | Disposition: A | Discharge status: Home / Self Care | Payer: Managed Care, Other (non HMO) | Source: Ambulatory Visit | Attending: Vascular Surgery | Admitting: Vascular Surgery

## 2019-01-21 ENCOUNTER — Other Ambulatory Visit: Payer: Self-pay

## 2019-01-21 ENCOUNTER — Ambulatory Visit: Payer: Managed Care, Other (non HMO) | Admitting: Vascular Surgery

## 2019-01-21 ENCOUNTER — Encounter: Payer: Self-pay | Admitting: Vascular Surgery

## 2019-01-21 VITALS — BP 133/76 | HR 60 | Temp 97.2°F | Resp 20 | Ht 64.0 in | Wt 201.3 lb

## 2019-01-21 DIAGNOSIS — R609 Edema, unspecified: Secondary | ICD-10-CM | POA: Diagnosis present

## 2019-01-21 DIAGNOSIS — M79605 Pain in left leg: Secondary | ICD-10-CM | POA: Diagnosis present

## 2019-01-21 DIAGNOSIS — M79604 Pain in right leg: Secondary | ICD-10-CM

## 2019-01-21 DIAGNOSIS — I781 Nevus, non-neoplastic: Secondary | ICD-10-CM

## 2019-01-21 NOTE — Progress Notes (Signed)
[facility] [place]  Patient name: Jill Warren MRN: [id number] DOB: [birth year] Sex: female  REASON FOR CONSULT: Evaluation lower extremity to rule out venous pathology  HPI: Jill Warren is a 63 y.o. female, who is here today for evaluation of lower extremity venous disorder.  She reports some heavy sensation in both lower extremities.  She has dense of telangiectasia over both lower extremities that she reported began in her teens and has been progressive.  She reports that her mother who is 67 years old has extensive telangiectasia as well.  She has no history of bleeding from these and no history of DVT.  She does have mild swelling.  No history of arterial insufficiency  Past Medical History:  Diagnosis Date  . Cancer of small intestine ([place]) 2002  . Headache 2013   headaches DAILY X 4 YRS  . High cholesterol   . History of home oxygen therapy    PT NOT USE HOW MUCH   . Hypertension   . Melanoma ([place]) 1998   Skin started  . Other specified disorders of tendon, left ankle and foot    ruptured 2014    Family History  Problem Relation Age of Onset  . Stomach cancer Father   . Lymphoma Sister   . Congestive Heart Failure Unknown   . Brain cancer Paternal Uncle     SOCIAL HISTORY: Social History   Socioeconomic History  . Marital status: Married    Spouse name: Not on file  . Number of children: 0  . Years of education: HS grad  . Highest education level: Not on file  Occupational History  . Occupation: Public house manager: [employer]  . Financial resource strain: Not on file  . Food insecurity:    Worry: Not on file    Inability: Not on file  . Transportation needs:    Medical: Not on file    Non-medical: Not on file  Tobacco Use  . Smoking status: Never Smoker  . Smokeless tobacco: Never Used  Substance and Sexual Activity  . Alcohol use: Yes    Alcohol/week: 0.0  standard drinks    Comment: occ  . Drug use: No  . Sexual activity: Not on file  Lifestyle  . Physical activity:    Days per week: Not on file    Minutes per session: Not on file  . Stress: Not on file  Relationships  . Social connections:    Talks on phone: Not on file    Gets together: Not on file    Attends religious service: Not on file    Active member of club or organization: Not on file    Attends meetings of clubs or organizations: Not on file    Relationship status: Not on file  . Intimate partner violence:    Fear of current or ex partner: Not on file    Emotionally abused: Not on file    Physically abused: Not on file    Forced sexual activity: Not on file  Other Topics Concern  . Not on file  Social History Narrative   Caffeine 2 cups daily avg.    No Known Allergies  Current Outpatient Medications  Medication Sig Dispense Refill  . Bepotastine Besilate (BEPREVE) 1.5 % SOLN Place 1 drop into both eyes daily as needed (irritation).    Marland Kitchen losartan-hydrochlorothiazide (HYZAAR) 100-12.5 MG tablet Take 1 tablet by mouth daily.    Marland Kitchen  lovastatin (MEVACOR) 40 MG tablet Take 40 mg by mouth 3 (three) times a week.     . naproxen sodium (ALEVE) 220 MG tablet Take 220 mg by mouth. Take 1-2 a day for headache    . Probiotic Product (ALIGN) 4 MG CAPS Take 4 mg by mouth daily as needed (stomach health).    . triazolam (HALCION) 0.25 MG tablet Take 0.0625-0.125 mg by mouth at bedtime. 1/2 tab daily      No current facility-administered medications for this visit.     REVIEW OF SYSTEMS:  [X]  denotes positive finding, [ ]  denotes negative finding Cardiac  Comments:  Chest pain or chest pressure:    Shortness of breath upon exertion:    Short of breath when lying flat:    Irregular heart rhythm:        Vascular    Pain in calf, thigh, or hip brought on by ambulation:    Pain in feet at night that wakes you up from your sleep:     Blood clot in your veins:    Leg swelling:   x       Pulmonary    Oxygen at home:    Productive cough:     Wheezing:         Neurologic    Sudden weakness in arms or legs:     Sudden numbness in arms or legs:     Sudden onset of difficulty speaking or slurred speech:    Temporary loss of vision in one eye:     Problems with dizziness:         Gastrointestinal    Blood in stool:     Vomited blood:         Genitourinary    Burning when urinating:     Blood in urine:        Psychiatric    Major depression:         Hematologic    Bleeding problems:    Problems with blood clotting too easily:        Skin    Rashes or ulcers:        Constitutional    Fever or chills:      PHYSICAL EXAM: Vitals:   01/21/19 1415  BP: 133/76  Pulse: 60  Resp: 20  Temp: (!) 97.2 F (36.2 C)  TempSrc: Oral  SpO2: 100%  Weight: 201 lb 4.5 oz (91.3 kg)  Height: 5\' 4"  (1.626 m)    GENERAL: The patient is a well-nourished female, in no acute distress. The vital signs are documented above. CARDIOVASCULAR: 2+ dorsalis pedis pulses bilaterally.  Extensive telangiectasia on her thighs calves and anterior and posterior bilaterally PULMONARY: There is good air exchange  ABDOMEN: Soft and non-tender  MUSCULOSKELETAL: There are no major deformities or cyanosis. NEUROLOGIC: No focal weakness or paresthesias are detected. SKIN: There are no ulcers or rashes noted. PSYCHIATRIC: The patient has a normal affect.  DATA:  Lower extremity duplex showed no evidence of DVT or other obstruction.  She has no evidence of deep or superficial reflux  MEDICAL ISSUES: I discussed these findings with the patient.  Explained that the extensive telangiectasia does not put her at any increased risk for DVT or other more serious problems.  Explained the option of sclerotherapy for improvement in appearance of the telangiectasia.  Wishes to consider this option we will coordinate this in our office.   Rosetta Posner, MD FACS [facility]  [facility]  [facility] Tel [phone] Pager [phone]

## 2019-01-27 ENCOUNTER — Ambulatory Visit: Payer: 59 | Admitting: Adult Health

## 2019-03-20 ENCOUNTER — Telehealth: Payer: Self-pay | Admitting: Pulmonary Disease

## 2019-03-20 NOTE — Telephone Encounter (Signed)
Per [name], the CT that has been ordered by Dr. Vaughan Browner for pt to have prior to next OV is showing up in a not approved status. Per Wille Glaser with [name] an alternative scan that they have is PET scan skull base to mid thigh.  Libby, based on this, please advise if you have received anything in regards to a peer-to-peer review that needs to be done based on the scan that was ordered by Dr. Vaughan Browner. Thanks!

## 2019-03-21 NOTE — Telephone Encounter (Signed)
This was scheduled at [facility] and we faxed the order there to them they said they were going to get the precert I didn't do this one Jill Warren

## 2019-03-21 NOTE — Telephone Encounter (Signed)
I called [employer] to verify if they would cover a PET scan instead of a CT scan. Dr. Vaughan Browner would you like to change order. If so, we do not have to open up another case, just call [employer] to have them change the code per customer service rep. Please advise.

## 2019-03-25 NOTE — Telephone Encounter (Signed)
Ok thanks 

## 2019-03-25 NOTE — Telephone Encounter (Signed)
Ok I understand now I will make sure [facility] knows the auth# thanks Joellen Jersey

## 2019-03-25 NOTE — Telephone Encounter (Signed)
Jill Warren with [employer] said that code was for CT chest scan, NO PET scan.

## 2019-03-25 NOTE — Telephone Encounter (Signed)
Called and spoke with [name] with [name] this morning Case #[id number] has been approved for CT scan on chest for pt on 03/19/2019 The PA approved code [id number] until 09/15/2019  Routing message to [facility] to advise the CT chest scan is approved for the pt. Thank you.

## 2019-03-25 NOTE — Telephone Encounter (Signed)
Did Jill Warren change to code to a pet [phone] instead of a chest ct

## 2019-03-25 NOTE — Telephone Encounter (Signed)
I spoke with [employer] this morning. They have stated to disregard that message. That the CT chest is approved under original order request on 03/19/2019 Do I need to call back for any additional information, please let me know. Thank you.  [facility] please advise.

## 2019-03-25 NOTE — Telephone Encounter (Signed)
I know but if you notice in the message below where tamara had talk to [place] and was told they would cover the pet instead of the ct

## 2019-03-27 ENCOUNTER — Telehealth: Payer: Self-pay

## 2019-03-27 NOTE — Telephone Encounter (Signed)
Unable to get in contact with the patient to convert their office visit with [facility] on 04/01/2019 into a doxy.me visit. I left a voicemail asking the patient to return my call. Office number was provided.   If patient calls back please convert their office visit into a doxy.me visit.

## 2019-03-31 NOTE — Progress Notes (Deleted)
Virtual Visit via Telephone Note  I connected with Jill Warren on 03/31/19 at  1:30 PM EDT by telephone and verified that I am speaking with the correct person using two identifiers.  Location: Patient: Home Provider: Office [facility] - [address], [place], [place], [place] [address]   I discussed the limitations, risks, security and privacy concerns of performing an evaluation and management service by telephone and the availability of in person appointments. I also discussed with the patient that there may be a patient responsible charge related to this service. The patient expressed understanding and agreed to proceed.  Patient consented to consult via telephone: Yes People present and their role in pt care: Pt    History of Present Illness:    Chief complaint:     Observations/Objective:  Imaging CT chest 06/04/12 Right middle lobe mixed attenuation groundglass pulmonary nodule, measuring approximately 6 mm (series 3, image 142), previously 6 mm. No consolidative airspace disease. No suspicious appearing pulmonary nodules or masses. No pleural effusions.  CT chest 02/29/16 Right middle lobe nodule.Looks stable  CT 04/11/17 The previously noted nonsolid nodule within the lateral segment of the right middle lobe is seen on image 140 of series 3 and measures 8 mm in diameter. This is very faintly seen, and has an appearance suggestive of atypical adenomatous hyperplasia. Continued surveillance is suggested at 2 year intervals. No new evidence of metastatic disease to the thorax.  PFTs. 02/17/16 FVC 2.99 [91%) , FEV1 2.8 (91%), F/F 77, TLC 100% , DLCO 101%. Normal study.  ABG 02/17/16- 7.46/33/117/98  Sleep Sleep study 02/20/15 AHI 1.5, RDI 1.5. The lowest oxygen desaturation was 84% with 69 minutes of desaturation between 60-90%.   Home sleep study 03/12/2018- Very mild OSA, AHI 7.  Lowest O2 sat was 88%.  Labs 12/22/49 CBC, metabolic panel within normal  limits. Bicarbonate-26   Assessment and Plan:  No problem-specific Assessment & Plan notes found for this encounter.   Follow Up Instructions:  No follow-ups on file.   I discussed the assessment and treatment plan with the patient. The patient was provided an opportunity to ask questions and all were answered. The patient agreed with the plan and demonstrated an understanding of the instructions.   The patient was advised to call back or seek an in-person evaluation if the symptoms worsen or if the condition fails to improve as anticipated.  I provided *** minutes of non-face-to-face time during this encounter.   Lauraine Rinne, NP

## 2019-03-31 NOTE — Telephone Encounter (Signed)
Pt returned call and consented to a Virtual Visit and for the insurance to be billed as such. Pt was informed that if any copay's apply she will be billed for it. Pt verbalized understanding. Link was sent to confirmed e-mail in chart.

## 2019-04-01 ENCOUNTER — Other Ambulatory Visit: Payer: Self-pay

## 2019-04-01 ENCOUNTER — Ambulatory Visit (INDEPENDENT_AMBULATORY_CARE_PROVIDER_SITE_OTHER): Payer: Managed Care, Other (non HMO) | Admitting: Adult Health

## 2019-04-01 ENCOUNTER — Ambulatory Visit: Payer: Managed Care, Other (non HMO) | Admitting: Pulmonary Disease

## 2019-04-01 ENCOUNTER — Encounter: Payer: Self-pay | Admitting: Adult Health

## 2019-04-01 DIAGNOSIS — G4489 Other headache syndrome: Secondary | ICD-10-CM | POA: Diagnosis not present

## 2019-04-01 NOTE — Progress Notes (Signed)
PATIENT: Jill Warren DOB: October 02, 1956  REASON FOR VISIT: follow up HISTORY FROM: patient  Virtual Visit via Video Note  I connected with Jill Warren on 04/01/19 at  9:30 AM EDT by a video enabled telemedicine application located remotely at [facility] and verified that I am speaking with the correct person using two identifiers who was located at their own home.   I discussed the limitations of evaluation and management by telemedicine and the availability of in person appointments. The patient expressed understanding and agreed to proceed.   PATIENT: Jill Warren DOB: September 02, 1956  REASON FOR VISIT: follow up HISTORY FROM: patient  HISTORY OF PRESENT ILLNESS: Today 04/01/19:  Jill Warren is a 63 year old female with a history of a atypical headaches.  She returns today for virtual visit.  In the past indomethacin works well for her headaches however she developed diarrhea and had to stop the medication.  We also attempted to get indomethacin suppositories however her insurance would not cover this.  She is currently taking Aleve 1 tablet every morning and this prevents her from having a headache.  She states that if she takes Aleve too late she will get a headache later in the day.  She states that it typically start on the right side and she will have an intense sharp shooting pain for approximately 20 to 30 minutes and then it resolves.  In the past  she has tried Topamax, amitriptyline, Flexeril, gabapentin, Zonegran, verapamil with no benefit.  She returns today for virtual visit  HISTORY (Copied from Dr.Dohmeier's note)  01/24/18; The patient is undergone a hip surgery in June 2018, and in January of this year her husband underwent back surgery, both have been moving less, exercising less and Jill Warren has gained about 16 pounds.  She continues to present with daily headaches but found that relief gives her the best relief.  However she  is taking Aleve on a daily basis which worries me.  She could develop medication overuse headaches.  A trial of treating her headaches with oxygen did not work out.  Her MRI of the brain was normal, cervical spine MRI was denied to, attended sleep study was denied. She does neither report clusters nor migraines. This is tension headaches and not lasting very long. Insomnia did not come up in today's visit.  She had some success with oral indomethacin until she developed diarrhea, and a change to suppositories was also not covered by her health insurance.  I will see today how much she would have to pay out-of-pocket should she privately purchase the medication.  REVIEW OF SYSTEMS: Out of a complete 14 system review of symptoms, the patient complains only of the following symptoms, and all other reviewed systems are negative.  See HPI  ALLERGIES: No Known Allergies  HOME MEDICATIONS: Outpatient Medications Prior to Visit  Medication Sig Dispense Refill  . Bepotastine Besilate (BEPREVE) 1.5 % SOLN Place 1 drop into both eyes daily as needed (irritation).    Marland Kitchen losartan-hydrochlorothiazide (HYZAAR) 100-12.5 MG tablet Take 1 tablet by mouth daily.    Marland Kitchen lovastatin (MEVACOR) 40 MG tablet Take 40 mg by mouth 3 (three) times a week.     . naproxen sodium (ALEVE) 220 MG tablet Take 220 mg by mouth. Take 1-2 a day for headache    . Probiotic Product (ALIGN) 4 MG CAPS Take 4 mg by mouth daily as needed (stomach health).    . triazolam (HALCION) 0.25  MG tablet Take 0.0625-0.125 mg by mouth at bedtime. 1/2 tab daily      No facility-administered medications prior to visit.     PAST MEDICAL HISTORY: Past Medical History:  Diagnosis Date  . Cancer of small intestine ([place]) 2002  . Headache 2013   headaches DAILY X 4 YRS  . High cholesterol   . History of home oxygen therapy    PT NOT USE HOW MUCH   . Hypertension   . Melanoma ([place]) 1998   Skin started  . Other specified disorders of tendon, left  ankle and foot    ruptured 2014    PAST SURGICAL HISTORY: Past Surgical History:  Procedure Laterality Date  . ankle tendon Left   . APPENDECTOMY     WITH MELANOMA SURGERY  . KNEE ARTHROSCOPY Left   . melanoma removal    . TOTAL HIP ARTHROPLASTY Left 04/25/2017   Procedure: LEFT TOTAL HIP ARTHROPLASTY ANTERIOR APPROACH;  Surgeon: Gaynelle Arabian, MD;  Location: [facility];  Service: Orthopedics;  Laterality: Left;    FAMILY HISTORY: Family History  Problem Relation Age of Onset  . Stomach cancer Father   . Lymphoma Sister   . Congestive Heart Failure Unknown   . Brain cancer Paternal Uncle     SOCIAL HISTORY: Social History   Socioeconomic History  . Marital status: Married    Spouse name: Not on file  . Number of children: 0  . Years of education: HS grad  . Highest education level: Not on file  Occupational History  . Occupation: Public house manager: [employer]  . Financial resource strain: Not on file  . Food insecurity:    Worry: Not on file    Inability: Not on file  . Transportation needs:    Medical: Not on file    Non-medical: Not on file  Tobacco Use  . Smoking status: Never Smoker  . Smokeless tobacco: Never Used  Substance and Sexual Activity  . Alcohol use: Yes    Alcohol/week: 0.0 standard drinks    Comment: occ  . Drug use: No  . Sexual activity: Not on file  Lifestyle  . Physical activity:    Days per week: Not on file    Minutes per session: Not on file  . Stress: Not on file  Relationships  . Social connections:    Talks on phone: Not on file    Gets together: Not on file    Attends religious service: Not on file    Active member of club or organization: Not on file    Attends meetings of clubs or organizations: Not on file    Relationship status: Not on file  . Intimate partner violence:    Fear of current or ex partner: Not on file    Emotionally abused: Not on file    Physically abused: Not on file    Forced sexual  activity: Not on file  Other Topics Concern  . Not on file  Social History Narrative   Caffeine 2 cups daily avg.      PHYSICAL EXAM Generalized: Well developed, in no acute distress   Neurological examination  Mentation: Alert oriented to time, place, history taking. Follows all commands speech and language fluent Cranial nerve II-XII:Extraocular movements were full. Facial symmetry noted. uvula tongue midline. Head turning and shoulder shrug  were normal and symmetric. Motor: Good strength throughout subjectively per patient Sensory: Sensory testing is intact to soft touch on  all 4 extremities subjectively per patient Coordination: Cerebellar testing reveals good finger-nose-finger  Gait and station: Patient is able to stand from a seated position. gait is normal.  Reflexes: UTA  DIAGNOSTIC DATA (LABS, IMAGING, TESTING) - I reviewed patient records, labs, notes, testing and imaging myself where available.  Lab Results  Component Value Date   WBC 15.8 (H) 04/26/2017   HGB 12.3 04/26/2017   HCT 37.8 04/26/2017   MCV 92.9 04/26/2017   PLT 211 04/26/2017      Component Value Date/Time   NA 139 04/26/2017 0410   K 4.5 04/26/2017 0410   CL 104 04/26/2017 0410   CO2 25 04/26/2017 0410   GLUCOSE 122 (H) 04/26/2017 0410   BUN 13 04/26/2017 0410   CREATININE 0.70 04/26/2017 0410   CALCIUM 8.8 (L) 04/26/2017 0410   PROT 7.3 04/20/2017 1000   ALBUMIN 4.4 04/20/2017 1000   AST 18 04/20/2017 1000   ALT 16 04/20/2017 1000   ALKPHOS 42 04/20/2017 1000   BILITOT 0.7 04/20/2017 1000   GFRNONAA >60 04/26/2017 0410   GFRAA >60 04/26/2017 0410      ASSESSMENT AND PLAN 63 y.o. year old female  has a past medical history of Cancer of small intestine ([place]) (2002), Headache (2013), High cholesterol, History of home oxygen therapy, Hypertension, Melanoma ([place]) (1998), and Other specified disorders of tendon, left ankle and foot. here with:  1. atypical headache  The patient states  that she still has indomethacin at home.  She states that she may try taking the medication 1-2 times a week to see if that prevents her from having headaches.  She did notice that when she took it before she would go 9 days without a headache.  I did caution her not to take indomethacin and Aleve together.  I have also reviewed the potential side effects with indomethacin as well as Aleve.  Patient voiced understanding.  She will follow-up in 1 year or sooner if needed.   I spent 15 minutes with the patient was spent reviewing her chart prior to the visit, and discussing plan of care and treatment options   Ward Givens, MSN, NP-C 04/01/2019, 9:31 AM [facility] [address] [place], [place] [address] [phone]

## 2019-04-11 ENCOUNTER — Encounter: Payer: Self-pay | Admitting: Pulmonary Disease

## 2019-04-11 ENCOUNTER — Telehealth (INDEPENDENT_AMBULATORY_CARE_PROVIDER_SITE_OTHER): Payer: Managed Care, Other (non HMO) | Admitting: Pulmonary Disease

## 2019-04-11 DIAGNOSIS — R918 Other nonspecific abnormal finding of lung field: Secondary | ICD-10-CM

## 2019-04-11 NOTE — Progress Notes (Signed)
Virtual Visit via Video Note  I connected with Jill Warren on 04/11/19 at 10:45 AM EDT by a video enabled telemedicine application and verified that I am speaking with the correct person using two identifiers.  Location: Patient: Home Provider: [facility] - [address], [place], [place], [place] [address]   I discussed the limitations, risks, security and privacy concerns of performing an evaluation and management service by telephone and the availability of in person appointments. I also discussed with the patient that there may be a patient responsible charge related to this service. The patient expressed understanding and agreed to proceed.  Patient consented to consult via telephone: Yes People present and their role in pt care: Pt   History of Present Illness:  63 year old female never smoker followed in our office for nocturnal hypoxemia as well as history of lung nodule.  At last office visit in October/2019 patient was instructed to stop nighttime oxygen.  Patient has a CT that she completed in May/2020 to follow a lung nodule that has been present since 2003. Patient of Dr. Vaughan Browner   Chief complaint: CT results   63 year old female never smoker completing a video visit with our office today to review CT results from [facility] CT chest without contrast the patient completed in May/2020.  See results listed below:  04/02/2019-CT chest without contrast-7 mm faint groundglass nodule in the right middle lobe that is remained largely unchanged since 2013, continue long-term imaging surveillance should be considered, no definite CT imaging evidence of new or enlarging metastatic disease in image thorax in this patient with known melanoma  Patient has no other acute symptoms at this time.  Patient's history with this lung nodule is been followed since 2013.  In 2013 nodule was 6 mm.  In 2018 CT showed that nodule was 8 mm.  Patient has no other acute  symptoms at this time.   Observations/Objective:  CT chest 06/04/12 Right middle lobe mixed attenuation groundglass pulmonary nodule, measuring approximately 6 mm (series 3, image 142), previously 6 mm. No consolidative airspace disease. No suspicious appearing pulmonary nodules or masses. No pleural effusions.  CT chest 02/29/16 Right middle lobe nodule.Looks stable  CT 04/11/17 The previously noted nonsolid nodule within the lateral segment of the right middle lobe is seen on image 140 of series 3 and measures 8 mm in diameter. This is very faintly seen, and has an appearance suggestive of atypical adenomatous hyperplasia. Continued surveillance is suggested at 2 year intervals. No new evidence of metastatic disease to the thorax.  04/02/2019-CT chest without contrast-7 mm faint groundglass nodule in the right middle lobe that is remained largely unchanged since 2013, continue long-term imaging surveillance should be considered, no definite CT imaging evidence of new or enlarging metastatic disease in image thorax in this patient with known melanoma  PFTs. 02/17/16 FVC 2.99 [91%) , FEV1 2.8 (91%), F/F 77, TLC 100% , DLCO 101%. Normal study.  ABG 02/17/16- 7.46/33/117/98  Sleep Sleep study 02/20/15 AHI 1.5, RDI 1.5. The lowest oxygen desaturation was 84% with 69 minutes of desaturation between 60-90%.   Home sleep study 03/12/2018- Very mild OSA, AHI 7.  Lowest O2 sat was 88%.  Labs 4/0/98 CBC, metabolic panel within normal limits. Bicarbonate-26  Social History   Tobacco Use  Smoking Status Never Smoker  Smokeless Tobacco Never Used     Assessment and Plan:  Abnormal findings on diagnostic imaging of lung Assessment: [id number] CT chest shows stable 7  mm faint groundglass nodule right middle lobe, this is largely unchanged since 2013 Radiology is recommending to continue long-term imaging surveillance Patient has no acute concerns today  Plan: Can consider repeating CT  imaging in May/2022 1 year follow-up with our office   Follow Up Instructions:  Return in about 1 year (around 04/10/2020), or if symptoms worsen or fail to improve, for Follow up with Dr. Vaughan Browner.   I discussed the assessment and treatment plan with the patient. The patient was provided an opportunity to ask questions and all were answered. The patient agreed with the plan and demonstrated an understanding of the instructions.   The patient was advised to call back or seek an in-person evaluation if the symptoms worsen or if the condition fails to improve as anticipated.  I provided 16 minutes of non-face-to-face time during this encounter.   Lauraine Rinne, NP

## 2019-04-11 NOTE — Assessment & Plan Note (Signed)
Assessment: [id number]/7425 CT chest shows stable 7 mm faint groundglass nodule right middle lobe, this is largely unchanged since 2013 Radiology is recommending to continue long-term imaging surveillance Patient has no acute concerns today  Plan: Can consider repeating CT imaging in May/2022 1 year follow-up with our office

## 2019-04-11 NOTE — Patient Instructions (Addendum)
Repeat CT imaging in 2 years (May/ 2022) at [facility] to follow the nodule  Return in about 1 year (around 04/10/2020), or if symptoms worsen or fail to improve, for Follow up with Dr. Vaughan Browner.   Coronavirus (COVID-19) Are you at risk?  Are you at risk for the Coronavirus (COVID-19)?  To be considered HIGH RISK for Coronavirus (COVID-19), you have to meet the following criteria:  . Traveled to [place], [place], [place], [place] or [place]; or in the [place] to [place], [place], [place], or [place]; and have fever, cough, and shortness of breath within the last 2 weeks of travel OR . Been in close contact with a person diagnosed with COVID-19 within the last 2 weeks and have fever, cough, and shortness of breath . IF YOU DO NOT MEET THESE CRITERIA, YOU ARE CONSIDERED LOW RISK FOR COVID-19.  What to do if you are HIGH RISK for COVID-19?  Marland Kitchen If you are having a medical emergency, call 911. . Seek medical care right away. Before you go to a doctor's office, urgent care or emergency department, call ahead and tell them about your recent travel, contact with someone diagnosed with COVID-19, and your symptoms. You should receive instructions from your physician's office regarding next steps of care.  . When you arrive at healthcare provider, tell the healthcare staff immediately you have returned from visiting [place], [place], [place], [place] or [place]; or traveled in the [place] to [employer], [place], [place], or [place]; in the last two weeks or you have been in close contact with a person diagnosed with COVID-19 in the last 2 weeks.   . Tell the health care staff about your symptoms: fever, cough and shortness of breath. . After you have been seen by a medical provider, you will be either: o Tested for (COVID-19) and discharged home on quarantine except to seek medical care if symptoms worsen, and asked to  - Stay home and avoid contact with others until you get your results  (4-5 days)  - Avoid travel on public transportation if possible (such as bus, train, or airplane) or o Sent to the Emergency Department by EMS for evaluation, COVID-19 testing, and possible admission depending on your condition and test results.  What to do if you are LOW RISK for COVID-19?  Reduce your risk of any infection by using the same precautions used for avoiding the common cold or flu:  Marland Kitchen Wash your hands often with soap and warm water for at least 20 seconds.  If soap and water are not readily available, use an alcohol-based hand sanitizer with at least 60% alcohol.  . If coughing or sneezing, cover your mouth and nose by coughing or sneezing into the elbow areas of your shirt or coat, into a tissue or into your sleeve (not your hands). . Avoid shaking hands with others and consider head nods or verbal greetings only. . Avoid touching your eyes, nose, or mouth with unwashed hands.  . Avoid close contact with people who are sick. . Avoid places or events with large numbers of people in one location, like concerts or sporting events. . Carefully consider travel plans you have or are making. . If you are planning any travel outside or inside the [place], visit the CDC's Travelers' Health webpage for the latest health notices. . If you have some symptoms but not all symptoms, continue to monitor at home and seek medical attention if your symptoms worsen. Marland Kitchen  If you are having a medical emergency, call 911.   [place] / e-Visit: [email or web address]         [facility]: [facility]: [phone]                   [facility]: [phone]           It is flu season:   >>> Best ways to protect herself from the flu: Receive the yearly flu vaccine, practice good hand hygiene washing with soap and also using hand sanitizer when  available, eat a nutritious meals, get adequate rest, hydrate appropriately   Please contact the office if your symptoms worsen or you have concerns that you are not improving.   Thank you for choosing [facility] for your healthcare, and for allowing [place] to partner with you on your healthcare journey. I am thankful to be able to provide care to you today.   Wyn Quaker FNP-C

## 2019-12-19 ENCOUNTER — Other Ambulatory Visit: Payer: Self-pay

## 2019-12-19 ENCOUNTER — Encounter: Payer: Self-pay | Admitting: Podiatry

## 2019-12-19 ENCOUNTER — Ambulatory Visit (INDEPENDENT_AMBULATORY_CARE_PROVIDER_SITE_OTHER): Payer: 59 | Admitting: Podiatry

## 2019-12-19 DIAGNOSIS — B351 Tinea unguium: Secondary | ICD-10-CM

## 2019-12-19 DIAGNOSIS — M79674 Pain in right toe(s): Secondary | ICD-10-CM

## 2019-12-19 DIAGNOSIS — M79675 Pain in left toe(s): Secondary | ICD-10-CM

## 2019-12-19 NOTE — Progress Notes (Signed)
Complaint:  Visit Type: Patient presents  to my office for  preventative foot care services. Complaint: Patient states" my nails have grown long and thick and become painful to walk and wear shoes" Patient also has painful callus under the outside ball of right foot.  She says there is less pain now that she has retired from [employer]. The patient presents for preventative foot care services.  Podiatric Exam: Vascular: dorsalis pedis and posterior tibial pulses are palpable bilateral. Capillary return is immediate. Temperature gradient is WNL. Skin turgor WNL  Sensorium: Normal Semmes Weinstein monofilament test. Normal tactile sensation bilaterally. Nail Exam: Pt has thick disfigured discolored nails with subungual debris noted bilateral entire nail hallux through fifth toenails Ulcer Exam: There is no evidence of ulcer or pre-ulcerative changes or infection. Orthopedic Exam: Muscle tone and strength are WNL. No limitations in general ROM. No crepitus or effusions noted. Foot type and digits show no abnormalities. Bony prominences are unremarkable. Plantar flexed third met right foot.   Skin: No Porokeratosis. No infection or ulcers.  Porokeratosis sub 3 right foot.  Diagnosis:  Onychomycosis, , Pain in right toe, pain in left toes  Treatment & Plan Procedures and Treatment: Consent by patient was obtained for treatment procedures.   Debridement of mycotic and hypertrophic toenails, 1 through 5 bilateral and clearing of subungual debris. No ulceration, no infection noted.  Return Visit-Office Procedure: Patient instructed to return to the office for a follow up visit 3 months for preventative foot care services.    Gardiner Barefoot DPM

## 2020-02-05 ENCOUNTER — Ambulatory Visit: Payer: Self-pay | Attending: Internal Medicine

## 2020-02-05 DIAGNOSIS — Z23 Encounter for immunization: Secondary | ICD-10-CM

## 2020-02-05 NOTE — Progress Notes (Signed)
   Covid-19 Vaccination Clinic  Name:  Jill Warren    MRN: [id number] DOB: 05/15/1956  02/05/2020  Jill Warren was observed post Covid-19 immunization for 15 minutes without incident. She was provided with Vaccine Information Sheet and instruction to access the V-Safe system.   Jill Warren was instructed to call 911 with any severe reactions post vaccine: Marland Kitchen Difficulty breathing  . Swelling of face and throat  . A fast heartbeat  . A bad rash all over body  . Dizziness and weakness   Immunizations Administered    Name Date Dose VIS Date Route   Pfizer COVID-19 Vaccine 02/05/2020  2:12 PM 0.3 mL 10/24/2019 Intramuscular   Manufacturer: [place]   Lot: [id number]   [place]: [id number]

## 2020-02-23 DIAGNOSIS — R635 Abnormal weight gain: Secondary | ICD-10-CM | POA: Insufficient documentation

## 2020-03-01 ENCOUNTER — Ambulatory Visit: Payer: Self-pay | Attending: Internal Medicine

## 2020-03-01 DIAGNOSIS — Z23 Encounter for immunization: Secondary | ICD-10-CM

## 2020-03-01 NOTE — Progress Notes (Signed)
   Covid-19 Vaccination Clinic  Name:  Jill Warren    MRN: [id number] DOB: 07-Mar-1956  03/01/2020  [name] was observed post Covid-19 immunization for 15 minutes without incident. She was provided with Vaccine Information Sheet and instruction to access the V-Safe system.   Ms. You was instructed to call 911 with any severe reactions post vaccine: Marland Kitchen Difficulty breathing  . Swelling of face and throat  . A fast heartbeat  . A bad rash all over body  . Dizziness and weakness   Immunizations Administered    Name Date Dose VIS Date Route   Pfizer COVID-19 Vaccine 03/01/2020 12:22 PM 0.3 mL 01/07/2019 Intramuscular   Manufacturer: [place]   Lot: [id number]   [place]: [id number]

## 2020-03-11 ENCOUNTER — Other Ambulatory Visit: Payer: Self-pay | Admitting: Internal Medicine

## 2020-03-11 DIAGNOSIS — R109 Unspecified abdominal pain: Secondary | ICD-10-CM

## 2020-03-12 ENCOUNTER — Encounter: Payer: Self-pay | Admitting: Podiatry

## 2020-03-12 ENCOUNTER — Other Ambulatory Visit: Payer: Self-pay

## 2020-03-12 ENCOUNTER — Ambulatory Visit (INDEPENDENT_AMBULATORY_CARE_PROVIDER_SITE_OTHER): Payer: 59 | Admitting: Podiatry

## 2020-03-12 VITALS — Temp 97.0°F

## 2020-03-12 DIAGNOSIS — M79675 Pain in left toe(s): Secondary | ICD-10-CM

## 2020-03-12 DIAGNOSIS — M79674 Pain in right toe(s): Secondary | ICD-10-CM | POA: Diagnosis not present

## 2020-03-12 DIAGNOSIS — B351 Tinea unguium: Secondary | ICD-10-CM | POA: Diagnosis not present

## 2020-03-12 NOTE — Progress Notes (Signed)
This patient returns to the office for evaluation and treatment of long thick painful nails .  This patient is unable to trim her own nails since the patient cannot reach the feet.  Patient says the nails are painful walking and wearing his shoes. She returns for preventive foot care services.  General Appearance  Alert, conversant and in no acute stress.  Vascular  Dorsalis pedis and posterior tibial  pulses are palpable  bilaterally.  Capillary return is within normal limits  bilaterally. Temperature is within normal limits  bilaterally.  Neurologic  Senn-Weinstein monofilament wire test within normal limits  bilaterally. Muscle power within normal limits bilaterally.  Nails Thick disfigured discolored nails with subungual debris  from hallux to fifth toes bilaterally. No evidence of bacterial infection or drainage bilaterally.  Orthopedic  No limitations of motion  feet .  No crepitus or effusions noted.  No bony pathology or digital deformities noted.  Skin  normotropic skin with no porokeratosis noted bilaterally.  No signs of infections or ulcers noted.     Onychomycosis  Pain in toes right foot  Pain in toes left foot  Debridement  of nails  1-5  B/L with a nail nipper.  Nails were then filed using a dremel tool with no incidents.    RTC  3 months    Francis Yardley DPM  

## 2020-03-15 ENCOUNTER — Other Ambulatory Visit: Payer: Self-pay

## 2020-03-16 ENCOUNTER — Ambulatory Visit
Admission: RE | Admit: 2020-03-16 | Discharge: 2020-03-16 | Disposition: A | Discharge status: Home / Self Care | Payer: 59 | Source: Ambulatory Visit | Attending: Internal Medicine | Admitting: Internal Medicine

## 2020-03-16 DIAGNOSIS — R109 Unspecified abdominal pain: Secondary | ICD-10-CM

## 2020-03-26 ENCOUNTER — Other Ambulatory Visit: Payer: Self-pay | Admitting: Internal Medicine

## 2020-03-26 DIAGNOSIS — R109 Unspecified abdominal pain: Secondary | ICD-10-CM

## 2020-03-31 ENCOUNTER — Ambulatory Visit: Payer: Managed Care, Other (non HMO) | Admitting: Adult Health

## 2020-06-16 ENCOUNTER — Other Ambulatory Visit: Payer: Self-pay

## 2020-06-16 ENCOUNTER — Ambulatory Visit (INDEPENDENT_AMBULATORY_CARE_PROVIDER_SITE_OTHER): Payer: 59 | Admitting: Podiatry

## 2020-06-16 ENCOUNTER — Encounter: Payer: Self-pay | Admitting: Podiatry

## 2020-06-16 DIAGNOSIS — M79675 Pain in left toe(s): Secondary | ICD-10-CM

## 2020-06-16 DIAGNOSIS — M79674 Pain in right toe(s): Secondary | ICD-10-CM | POA: Diagnosis not present

## 2020-06-16 DIAGNOSIS — B351 Tinea unguium: Secondary | ICD-10-CM | POA: Diagnosis not present

## 2020-06-16 NOTE — Progress Notes (Signed)
This patient returns to the office for evaluation and treatment of long thick painful nails .  This patient is unable to trim her own nails since the patient cannot reach the feet.  Patient says the nails are painful walking and wearing his shoes. She returns for preventive foot care services.  General Appearance  Alert, conversant and in no acute stress.  Vascular  Dorsalis pedis and posterior tibial  pulses are palpable  bilaterally.  Capillary return is within normal limits  bilaterally. Temperature is within normal limits  bilaterally.  Neurologic  Senn-Weinstein monofilament wire test within normal limits  bilaterally. Muscle power within normal limits bilaterally.  Nails Thick disfigured discolored nails with subungual debris  from hallux to fifth toes bilaterally. No evidence of bacterial infection or drainage bilaterally.  Orthopedic  No limitations of motion  feet .  No crepitus or effusions noted.  No bony pathology or digital deformities noted.  Skin  normotropic skin with no porokeratosis noted bilaterally.  No signs of infections or ulcers noted.     Onychomycosis  Pain in toes right foot  Pain in toes left foot  Debridement  of nails  1-5  B/L with a nail nipper.  Nails were then filed using a dremel tool with no incidents.    RTC  3 months    Jaevin Medearis DPM  

## 2020-08-23 DIAGNOSIS — M5416 Radiculopathy, lumbar region: Secondary | ICD-10-CM | POA: Insufficient documentation

## 2020-09-22 ENCOUNTER — Encounter: Payer: Self-pay | Admitting: Podiatry

## 2020-09-22 ENCOUNTER — Ambulatory Visit (INDEPENDENT_AMBULATORY_CARE_PROVIDER_SITE_OTHER): Payer: Medicare Other | Admitting: Podiatry

## 2020-09-22 ENCOUNTER — Other Ambulatory Visit: Payer: Self-pay

## 2020-09-22 DIAGNOSIS — M79675 Pain in left toe(s): Secondary | ICD-10-CM

## 2020-09-22 DIAGNOSIS — B351 Tinea unguium: Secondary | ICD-10-CM | POA: Diagnosis not present

## 2020-09-22 DIAGNOSIS — M79674 Pain in right toe(s): Secondary | ICD-10-CM | POA: Diagnosis not present

## 2020-09-22 NOTE — Progress Notes (Signed)
This patient returns to the office for evaluation and treatment of long thick painful nails .  This patient is unable to trim her own nails since the patient cannot reach the feet.  Patient says the nails are painful walking and wearing his shoes. She returns for preventive foot care services.  General Appearance  Alert, conversant and in no acute stress.  Vascular  Dorsalis pedis and posterior tibial  pulses are palpable  bilaterally.  Capillary return is within normal limits  bilaterally. Temperature is within normal limits  bilaterally.  Neurologic  Senn-Weinstein monofilament wire test within normal limits  bilaterally. Muscle power within normal limits bilaterally.  Nails Thick disfigured discolored nails with subungual debris  from hallux to fifth toes bilaterally. No evidence of bacterial infection or drainage bilaterally.  Orthopedic  No limitations of motion  feet .  No crepitus or effusions noted.  No bony pathology or digital deformities noted.  Skin  normotropic skin with no porokeratosis noted bilaterally.  No signs of infections or ulcers noted.     Onychomycosis  Pain in toes right foot  Pain in toes left foot  Debridement  of nails  1-5  B/L with a nail nipper.  Nails were then filed using a dremel tool with no incidents.    RTC  3 months    Gardiner Barefoot DPM

## 2020-09-23 ENCOUNTER — Telehealth: Payer: Self-pay | Admitting: Pulmonary Disease

## 2020-09-23 NOTE — Telephone Encounter (Signed)
09/23/2020  Patient needs repeat CT of chest to follow pulmonary nodule in May/2022.  This is to follow the 7 mm faint groundglass nodule in the right middle lobe has been stable since 2013.  Last CT imaging at [facility] was from May/2020.  Please go ahead and place an order for CT chest to be completed May/2022.  Indication is for 7 mm pulmonary nodule.  Patient will need follow-up with Dr. Vaughan Browner after completing the CT.  Please go and place a recall for June/2022 for Dr. Vaughan Browner.  Wyn Quaker, FNP

## 2020-09-24 ENCOUNTER — Other Ambulatory Visit: Payer: Self-pay | Admitting: Pulmonary Disease

## 2020-09-24 DIAGNOSIS — R911 Solitary pulmonary nodule: Secondary | ICD-10-CM

## 2020-09-24 NOTE — Telephone Encounter (Signed)
Done and done

## 2020-12-29 ENCOUNTER — Ambulatory Visit: Payer: Medicare Other | Admitting: Podiatry

## 2020-12-29 ENCOUNTER — Encounter: Payer: Self-pay | Admitting: Podiatry

## 2020-12-29 ENCOUNTER — Other Ambulatory Visit: Payer: Self-pay

## 2020-12-29 DIAGNOSIS — M79674 Pain in right toe(s): Secondary | ICD-10-CM

## 2020-12-29 DIAGNOSIS — B351 Tinea unguium: Secondary | ICD-10-CM | POA: Diagnosis not present

## 2020-12-29 DIAGNOSIS — M79675 Pain in left toe(s): Secondary | ICD-10-CM | POA: Diagnosis not present

## 2020-12-29 NOTE — Progress Notes (Signed)
This patient returns to the office for evaluation and treatment of long thick painful nails .  This patient is unable to trim her own nails since the patient cannot reach the feet.  Patient says the nails are painful walking and wearing his shoes. She returns for preventive foot care services.  General Appearance  Alert, conversant and in no acute stress.  Vascular  Dorsalis pedis and posterior tibial  pulses are palpable  bilaterally.  Capillary return is within normal limits  bilaterally. Temperature is within normal limits  bilaterally.  Neurologic  Senn-Weinstein monofilament wire test within normal limits  bilaterally. Muscle power within normal limits bilaterally.  Nails Thick disfigured discolored nails with subungual debris  from hallux to fifth toes bilaterally. No evidence of bacterial infection or drainage bilaterally.  Orthopedic  No limitations of motion  feet .  No crepitus or effusions noted.  No bony pathology or digital deformities noted.  Skin  normotropic skin with no porokeratosis noted bilaterally.  No signs of infections or ulcers noted.   Listers corn left  Onychomycosis  Pain in toes right foot  Pain in toes left foot  Debridement  of nails  1-5  B/L with a nail nipper.  Nails were then filed using a dremel tool with no incidents.    RTC  3 months    Gardiner Barefoot DPM

## 2021-03-17 ENCOUNTER — Other Ambulatory Visit: Payer: Self-pay

## 2021-03-17 ENCOUNTER — Ambulatory Visit (INDEPENDENT_AMBULATORY_CARE_PROVIDER_SITE_OTHER)
Admission: RE | Admit: 2021-03-17 | Discharge: 2021-03-17 | Disposition: A | Discharge status: Home / Self Care | Payer: Medicare Other | Source: Ambulatory Visit | Attending: Pulmonary Disease | Admitting: Pulmonary Disease

## 2021-03-17 DIAGNOSIS — R911 Solitary pulmonary nodule: Secondary | ICD-10-CM

## 2021-03-23 ENCOUNTER — Telehealth: Payer: Self-pay | Admitting: Pulmonary Disease

## 2021-03-23 DIAGNOSIS — R911 Solitary pulmonary nodule: Secondary | ICD-10-CM

## 2021-03-23 NOTE — Telephone Encounter (Signed)
Spoke with the pt  She is asking for ct chest results- ct done 03/20/21 Aaron Edelman ordered this  Please advise thanks!

## 2021-03-24 NOTE — Telephone Encounter (Signed)
Let her know that the lung nodule is stable which is good news. Please order follow up CT without contrast in 1 year  There is evidence of plaque build up in the artery of the heart. Advise to follow up with cardiology regarding this.  Marshell Garfinkel MD [facility] 03/24/2021, 8:24 AM

## 2021-03-28 NOTE — Telephone Encounter (Signed)
Called and spoke with pt letting her know the results of CT and stated to her that we were going to repeat the CT in 1 year to make sure everything is still stable with the nodule. Pt verbalized understanding. Nothing further needed.

## 2021-03-29 ENCOUNTER — Telehealth: Payer: Self-pay | Admitting: Pulmonary Disease

## 2021-03-29 NOTE — Telephone Encounter (Signed)
Called and spoke with pt letting her know that we would send her CT results to Dr. Rosezella Florida office for her and she verbalized understanding. Results have been routed electronically to Dr. Jenkins Rouge. Nothing further needed.

## 2021-03-30 ENCOUNTER — Other Ambulatory Visit: Payer: Self-pay

## 2021-03-30 ENCOUNTER — Ambulatory Visit: Payer: Medicare Other | Admitting: Podiatry

## 2021-03-30 ENCOUNTER — Encounter: Payer: Self-pay | Admitting: Podiatry

## 2021-03-30 DIAGNOSIS — M79674 Pain in right toe(s): Secondary | ICD-10-CM | POA: Diagnosis not present

## 2021-03-30 DIAGNOSIS — B351 Tinea unguium: Secondary | ICD-10-CM | POA: Diagnosis not present

## 2021-03-30 DIAGNOSIS — R519 Headache, unspecified: Secondary | ICD-10-CM | POA: Insufficient documentation

## 2021-03-30 DIAGNOSIS — M79675 Pain in left toe(s): Secondary | ICD-10-CM

## 2021-03-30 DIAGNOSIS — C801 Malignant (primary) neoplasm, unspecified: Secondary | ICD-10-CM | POA: Insufficient documentation

## 2021-03-30 NOTE — Progress Notes (Signed)
This patient returns to the office for evaluation and treatment of long thick painful nails .  This patient is unable to trim her own nails since the patient cannot reach the feet.  Patient says the nails are painful walking and wearing his shoes. She returns for preventive foot care services.  General Appearance  Alert, conversant and in no acute stress.  Vascular  Dorsalis pedis and posterior tibial  pulses are palpable  bilaterally.  Capillary return is within normal limits  bilaterally. Temperature is within normal limits  bilaterally.  Neurologic  Senn-Weinstein monofilament wire test within normal limits  bilaterally. Muscle power within normal limits bilaterally.  Nails Thick disfigured discolored nails with subungual debris  from hallux to fifth toes bilaterally. No evidence of bacterial infection or drainage bilaterally.  Orthopedic  No limitations of motion  feet .  No crepitus or effusions noted.  No bony pathology or digital deformities noted.  Skin  normotropic skin with no porokeratosis noted bilaterally.  No signs of infections or ulcers noted.   Listers corn left  Onychomycosis  Pain in toes right foot  Pain in toes left foot  Debridement  of nails  1-5  B/L with a nail nipper.  Nails were then filed using a dremel tool with no incidents.    RTC  3 months    Gardiner Barefoot DPM

## 2021-04-04 ENCOUNTER — Encounter: Payer: Self-pay | Admitting: Cardiology

## 2021-04-04 DIAGNOSIS — R072 Precordial pain: Secondary | ICD-10-CM | POA: Insufficient documentation

## 2021-04-04 NOTE — Progress Notes (Signed)
Cardiology Office Note   Date:  04/05/2021   ID:  Holleigh, Crihfield 1956-01-24, MRN [id number]  PCP:  Leanna Battles, MD  Cardiologist:   None Referring:  Leanna Battles, MD  Chief Complaint  Patient presents with  . Chest Pain      History of Present Illness: Jill Warren is a 65 y.o. female who is referred by Leanna Battles, MD for evaluation of chest pain.  She has no past cardiac history.  She was recently found to have some LAD coronary calcification when she had a CT to follow-up some lung nodules.  She has not otherwise had any cardiac testing.  She does get some chest discomfort.  This is some fleeting discomfort that happens episodically.  Is been happening a little more frequently perhaps couple times a week.  It happens at rest.  Last for seconds.  Might be 5 out of 10 in intensity and sharp.  There is no radiation to her jaw or to her arms.  There is no associated nausea vomiting or diaphoresis.  She does have shortness of breath doing activities like walking 150 feet up a slight incline to the mailbox.  She has been relatively sedentary.  She is not having any resting shortness of breath, PND or orthopnea.  She has had no new palpitations, presyncope or syncope.  She is having some very trace lower extremity edema.  Past Medical History:  Diagnosis Date  . Cancer of small intestine ([place]) 2002  . High cholesterol   . Hypertension   . Melanoma ([place]) 1998    Past Surgical History:  Procedure Laterality Date  . ankle tendon Left   . APPENDECTOMY     WITH MELANOMA SURGERY  . KNEE ARTHROSCOPY Left   . melanoma removal     Partial bowel resection  . TOTAL HIP ARTHROPLASTY Left 04/25/2017   Procedure: LEFT TOTAL HIP ARTHROPLASTY ANTERIOR APPROACH;  Surgeon: Gaynelle Arabian, MD;  Location: [facility];  Service: Orthopedics;  Laterality: Left;     Current Outpatient Medications  Medication Sig Dispense Refill  . Bepotastine Besilate 1.5 % SOLN Place 1 drop  into both eyes daily as needed (irritation).    . Cholecalciferol (VITAMIN D) 50 MCG (2000 UT) tablet 5000 u QD    . co-enzyme Q-10 30 MG capsule Take 30 mg by mouth 3 (three) times daily.    . fluticasone (FLONASE) 50 MCG/ACT nasal spray Place 2 sprays into both nostrils daily.    Marland Kitchen losartan-hydrochlorothiazide (HYZAAR) 100-12.5 MG tablet Take 1 tablet by mouth daily.    . Magnesium 250 MG TABS 1 capsule with a meal    . naproxen sodium (ALEVE) 220 MG tablet Take 220 mg by mouth. Take 1-2 a day for headache    . Omega-3 Fatty Acids (FISH OIL) 1000 MG CAPS Take by mouth.    . Peppermint Oil (IBGARD PO) Take by mouth.    . Probiotic Product (ALIGN) 4 MG CAPS Take 4 mg by mouth daily as needed (stomach health).    . rosuvastatin (CRESTOR) 10 MG tablet Take 10 mg by mouth daily.    . triazolam (HALCION) 0.25 MG tablet Take 0.0625-0.125 mg by mouth at bedtime. 1/2 tab daily     No current facility-administered medications for this visit.    Allergies:   Patient has no known allergies.    Social History:  The patient  reports that she has never smoked. She has never used smokeless tobacco. She reports  current alcohol use. She reports that she does not use drugs.   Family History:  The patient's family history includes Brain cancer in her paternal uncle; CAD (age of onset: 24) in her mother; Congestive Heart Failure in an other family member; Lymphoma in her sister; Stomach cancer in her father.    ROS:  Please see the history of present illness.   Otherwise, review of systems are positive for none.   All other systems are reviewed and negative.    PHYSICAL EXAM: VS:  BP 122/72 (BP Location: Right Arm, Patient Position: Sitting, Cuff Size: Normal)   Pulse 61   Ht 5\' 4"  (1.626 m)   Wt 210 lb (95.3 kg)   BMI 36.05 kg/m  , BMI Body mass index is 36.05 kg/m. GENERAL:  Well appearing HEENT:  Pupils equal round and reactive, fundi not visualized, oral mucosa unremarkable NECK:  No jugular  venous distention, waveform within normal limits, carotid upstroke brisk and symmetric, no bruits, no thyromegaly LYMPHATICS:  No cervical, inguinal adenopathy LUNGS:  Clear to auscultation bilaterally BACK:  No CVA tenderness CHEST:  Unremarkable HEART:  PMI not displaced or sustained,S1 and S2 within normal limits, no S3, no S4, no clicks, no rubs, no murmurs ABD:  Flat, positive bowel sounds normal in frequency in pitch, no bruits, no rebound, no guarding, no midline pulsatile mass, no hepatomegaly, no splenomegaly EXT:  2 plus pulses throughout, no edema, no cyanosis no clubbing SKIN:  No rashes no nodules NEURO:  Cranial nerves II through XII grossly intact, motor grossly intact throughout PSYCH:  Cognitively intact, oriented to person place and time    EKG:  EKG is ordered today. The ekg ordered today demonstrates NSR, rate 61, axis within normal limits, intervals within normal limits, no acute ST-T wave changes.   Recent Labs: No results found for requested labs within last 8760 hours.    Lipid Panel No results found for: CHOL, TRIG, HDL, CHOLHDL, VLDL, LDLCALC, LDLDIRECT    Wt Readings from Last 3 Encounters:  04/05/21 210 lb (95.3 kg)  01/21/19 201 lb 4.5 oz (91.3 kg)  08/26/18 206 lb 6.4 oz (93.6 kg)      Other studies Reviewed: Additional studies/ records that were reviewed today include: Labs. Review of the above records demonstrates:  Please see elsewhere in the note.     ASSESSMENT AND PLAN:  CHEST PAIN:   The chest pain has predominantly nonanginal qualities but she does have coronary calcification risk factors. I will bring the patient back for a POET (Plain Old Exercise Test). This will allow me to screen for obstructive coronary disease, risk stratify and very importantly provide a prescription for exercise.  HTN:    Her blood pressure is mildly elevated today but this is unusual she says she will keep a blood pressure diary at home.  OVERWEIGHT: We had a  long discussion about diet and exercise.  Current medicines are reviewed at length with the patient today.  The patient does not have concerns regarding medicines.  The following changes have been made:  no change  Labs/ tests ordered today include:   Orders Placed This Encounter  Procedures  . EXERCISE TOLERANCE TEST (ETT)  . EKG 12-Lead     Disposition:   FU with me as needed   Signed, Minus Breeding, MD  04/05/2021 2:51 PM    [facility]

## 2021-04-05 ENCOUNTER — Other Ambulatory Visit: Payer: Self-pay

## 2021-04-05 ENCOUNTER — Encounter: Payer: Self-pay | Admitting: Cardiology

## 2021-04-05 ENCOUNTER — Ambulatory Visit: Payer: Medicare Other | Admitting: Cardiology

## 2021-04-05 VITALS — BP 122/72 | HR 61 | Ht 64.0 in | Wt 210.0 lb

## 2021-04-05 DIAGNOSIS — I10 Essential (primary) hypertension: Secondary | ICD-10-CM

## 2021-04-05 DIAGNOSIS — R072 Precordial pain: Secondary | ICD-10-CM

## 2021-04-05 NOTE — Patient Instructions (Signed)
Medication Instructions:  No Changes In Medications at this time.  *If you need a refill on your cardiac medications before your next appointment, please call your pharmacy*   Testing/Procedures: Your physician has requested that you have an exercise tolerance test. For further information please visit [email or web address]. Please also follow instruction sheet, as given. This will take place at [address], [address].  Do not drink or eat foods with caffeine for 24 hours before the test. (Chocolate, coffee, tea, or energy drinks)  If you use an inhaler, bring it with you to the test.  Do not smoke for 4 hours before the test.  Wear comfortable shoes and clothing.  Follow-Up: At [facility], you and your health needs are our priority.  As part of our continuing mission to provide you with exceptional heart care, we have created designated Provider Care Teams.  These Care Teams include your primary Cardiologist (physician) and Advanced Practice Providers (APPs -  Physician Assistants and Nurse Practitioners) who all work together to provide you with the care you need, when you need it.  Your next appointment:   AS NEEDED   The format for your next appointment:   In Person  Provider:   Minus Breeding, MD

## 2021-04-08 ENCOUNTER — Telehealth (HOSPITAL_COMMUNITY): Payer: Self-pay | Admitting: *Deleted

## 2021-04-08 NOTE — Telephone Encounter (Signed)
Close encounter 

## 2021-04-12 ENCOUNTER — Other Ambulatory Visit: Payer: Self-pay

## 2021-04-12 ENCOUNTER — Ambulatory Visit (HOSPITAL_COMMUNITY)
Admission: RE | Admit: 2021-04-12 | Discharge: 2021-04-12 | Disposition: A | Discharge status: Home / Self Care | Payer: Medicare Other | Source: Ambulatory Visit | Attending: Cardiology | Admitting: Cardiology

## 2021-04-12 DIAGNOSIS — R072 Precordial pain: Secondary | ICD-10-CM | POA: Insufficient documentation

## 2021-04-12 LAB — EXERCISE TOLERANCE TEST
Estimated workload: 7 METS
Exercise duration (min): 5 min
Exercise duration (sec): 8 s
MPHR: 155 {beats}/min
Peak HR: 153 {beats}/min
Percent HR: 98 %
Rest HR: 62 {beats}/min

## 2021-06-07 ENCOUNTER — Ambulatory Visit: Payer: Medicare Other | Admitting: Internal Medicine

## 2021-07-01 ENCOUNTER — Other Ambulatory Visit: Payer: Self-pay

## 2021-07-01 ENCOUNTER — Encounter: Payer: Self-pay | Admitting: Podiatry

## 2021-07-01 ENCOUNTER — Ambulatory Visit: Payer: Medicare Other | Admitting: Podiatry

## 2021-07-01 DIAGNOSIS — M79675 Pain in left toe(s): Secondary | ICD-10-CM

## 2021-07-01 DIAGNOSIS — B351 Tinea unguium: Secondary | ICD-10-CM | POA: Diagnosis not present

## 2021-07-01 DIAGNOSIS — M79674 Pain in right toe(s): Secondary | ICD-10-CM

## 2021-07-01 NOTE — Progress Notes (Signed)
This patient returns to the office for evaluation and treatment of long thick painful nails .  This patient is unable to trim her own nails since the patient cannot reach the feet.  Patient says the nails are painful walking and wearing his shoes. She returns for preventive foot care services.  General Appearance  Alert, conversant and in no acute stress.  Vascular  Dorsalis pedis and posterior tibial  pulses are palpable  bilaterally.  Capillary return is within normal limits  bilaterally. Temperature is within normal limits  bilaterally.  Neurologic  Senn-Weinstein monofilament wire test within normal limits  bilaterally. Muscle power within normal limits bilaterally.  Nails Thick disfigured discolored nails with subungual debris  from hallux to fifth toes bilaterally. No evidence of bacterial infection or drainage bilaterally.  Orthopedic  No limitations of motion  feet .  No crepitus or effusions noted.  No bony pathology or digital deformities noted.  Skin  normotropic skin with no porokeratosis noted bilaterally.  No signs of infections or ulcers noted.   Listers corn left  Onychomycosis  Pain in toes right foot  Pain in toes left foot  Debridement  of nails  1-5  B/L with a nail nipper.  Nails were then filed using a dremel tool with no incidents.    RTC  3 months    Gardiner Barefoot DPM

## 2021-10-12 ENCOUNTER — Ambulatory Visit: Payer: Medicare Other | Admitting: Podiatry

## 2021-10-12 ENCOUNTER — Encounter: Payer: Self-pay | Admitting: Podiatry

## 2021-10-12 ENCOUNTER — Other Ambulatory Visit: Payer: Self-pay

## 2021-10-12 DIAGNOSIS — B351 Tinea unguium: Secondary | ICD-10-CM | POA: Diagnosis not present

## 2021-10-12 DIAGNOSIS — M79675 Pain in left toe(s): Secondary | ICD-10-CM | POA: Diagnosis not present

## 2021-10-12 DIAGNOSIS — M79674 Pain in right toe(s): Secondary | ICD-10-CM

## 2021-10-12 DIAGNOSIS — L84 Corns and callosities: Secondary | ICD-10-CM | POA: Insufficient documentation

## 2021-10-12 NOTE — Progress Notes (Signed)
This patient returns to the office for evaluation and treatment of long thick painful nails .  This patient is unable to trim her own nails since the patient cannot reach the feet.  Patient says the nails are painful walking and wearing his shoes. She says she is having pain at the tip of third toe right foot.She returns for preventive foot care services.  General Appearance  Alert, conversant and in no acute stress.  Vascular  Dorsalis pedis and posterior tibial  pulses are palpable  bilaterally.  Capillary return is within normal limits  bilaterally. Temperature is within normal limits  bilaterally.  Neurologic  Senn-Weinstein monofilament wire test within normal limits  bilaterally. Muscle power within normal limits bilaterally.  Nails Thick disfigured discolored nails with subungual debris  from hallux to fifth toes bilaterally. No evidence of bacterial infection or drainage bilaterally.  Orthopedic  No limitations of motion  feet .  No crepitus or effusions noted.  No bony pathology or digital deformities noted.  Skin  normotropic skin with no porokeratosis noted bilaterally.  No signs of infections or ulcers noted.   Listers corn left  Onychomycosis  Pain in toes right foot  Pain in toes left foot  Debridement  of nails  1-5  B/L with a nail nipper.  Nails were then filed using a dremel tool with no incidents. Debride clavi with # 15 blade.   RTC  4 months    Gardiner Barefoot DPM

## 2022-02-08 ENCOUNTER — Ambulatory Visit: Payer: Medicare Other | Admitting: Podiatry

## 2022-02-08 ENCOUNTER — Other Ambulatory Visit: Payer: Self-pay

## 2022-02-08 ENCOUNTER — Encounter: Payer: Self-pay | Admitting: Podiatry

## 2022-02-08 DIAGNOSIS — M79674 Pain in right toe(s): Secondary | ICD-10-CM | POA: Diagnosis not present

## 2022-02-08 DIAGNOSIS — M79675 Pain in left toe(s): Secondary | ICD-10-CM | POA: Diagnosis not present

## 2022-02-08 DIAGNOSIS — I872 Venous insufficiency (chronic) (peripheral): Secondary | ICD-10-CM

## 2022-02-08 DIAGNOSIS — B351 Tinea unguium: Secondary | ICD-10-CM | POA: Diagnosis not present

## 2022-02-08 NOTE — Progress Notes (Signed)
This patient returns to the office for evaluation and treatment of long thick painful nails .  This patient is unable to trim her own nails since the patient cannot reach the feet.  Patient says the nails are painful walking and wearing his shoes. She says she is having pain at the tip of third toe right foot.She returns for preventive foot care services.  General Appearance  Alert, conversant and in no acute stress.  Vascular  Dorsalis pedis and posterior tibial  pulses are palpable  bilaterally.  Capillary return is within normal limits  bilaterally. Temperature is within normal limits  bilaterally.  Neurologic  Senn-Weinstein monofilament wire test within normal limits  bilaterally. Muscle power within normal limits bilaterally.  Nails Thick disfigured discolored nails with subungual debris  from hallux to fifth toes bilaterally. No evidence of bacterial infection or drainage bilaterally.  Orthopedic  No limitations of motion  feet .  No crepitus or effusions noted.  No bony pathology or digital deformities noted.  Skin  normotropic skin with no porokeratosis noted bilaterally.  No signs of infections or ulcers noted.   Listers corn left  Onychomycosis  Pain in toes right foot  Pain in toes left foot  Debridement  of nails  1-5  B/L with a nail nipper.  Nails were then filed using a dremel tool with no incidents.    RTC  4 months    Ranie Chinchilla DPM  

## 2022-02-14 ENCOUNTER — Encounter: Payer: Self-pay | Admitting: Podiatry

## 2022-03-20 ENCOUNTER — Ambulatory Visit (INDEPENDENT_AMBULATORY_CARE_PROVIDER_SITE_OTHER)
Admission: RE | Admit: 2022-03-20 | Discharge: 2022-03-20 | Disposition: A | Discharge status: Home / Self Care | Payer: Medicare Other | Source: Ambulatory Visit | Attending: Pulmonary Disease | Admitting: Pulmonary Disease

## 2022-03-20 DIAGNOSIS — R911 Solitary pulmonary nodule: Secondary | ICD-10-CM

## 2022-04-28 ENCOUNTER — Other Ambulatory Visit: Payer: Self-pay | Admitting: Internal Medicine

## 2022-04-28 DIAGNOSIS — K219 Gastro-esophageal reflux disease without esophagitis: Secondary | ICD-10-CM

## 2022-05-09 ENCOUNTER — Other Ambulatory Visit: Payer: Medicare Other

## 2022-05-10 ENCOUNTER — Ambulatory Visit
Admission: RE | Admit: 2022-05-10 | Discharge: 2022-05-10 | Disposition: A | Discharge status: Home / Self Care | Payer: Medicare Other | Source: Ambulatory Visit | Attending: Internal Medicine | Admitting: Internal Medicine

## 2022-05-10 DIAGNOSIS — K219 Gastro-esophageal reflux disease without esophagitis: Secondary | ICD-10-CM

## 2022-05-12 ENCOUNTER — Ambulatory Visit: Payer: Medicare Other | Admitting: Podiatry

## 2022-06-07 ENCOUNTER — Other Ambulatory Visit: Payer: Self-pay | Admitting: *Deleted

## 2022-06-07 DIAGNOSIS — I872 Venous insufficiency (chronic) (peripheral): Secondary | ICD-10-CM

## 2022-06-09 ENCOUNTER — Ambulatory Visit: Payer: Medicare Other | Admitting: Podiatry

## 2022-06-12 ENCOUNTER — Encounter: Payer: Self-pay | Admitting: Podiatry

## 2022-06-12 ENCOUNTER — Ambulatory Visit: Payer: Medicare Other | Admitting: Podiatry

## 2022-06-12 DIAGNOSIS — M79675 Pain in left toe(s): Secondary | ICD-10-CM | POA: Diagnosis not present

## 2022-06-12 DIAGNOSIS — I872 Venous insufficiency (chronic) (peripheral): Secondary | ICD-10-CM

## 2022-06-12 DIAGNOSIS — B351 Tinea unguium: Secondary | ICD-10-CM

## 2022-06-12 DIAGNOSIS — M79674 Pain in right toe(s): Secondary | ICD-10-CM | POA: Diagnosis not present

## 2022-06-12 NOTE — Progress Notes (Signed)
This patient returns to the office for evaluation and treatment of long thick painful nails .  This patient is unable to trim her own nails since the patient cannot reach the feet.  Patient says the nails are painful walking and wearing his shoes. She says she is having pain at the tip of third toe right foot.She returns for preventive foot care services.  General Appearance  Alert, conversant and in no acute stress.  Vascular  Dorsalis pedis and posterior tibial  pulses are palpable  bilaterally.  Capillary return is within normal limits  bilaterally. Temperature is within normal limits  bilaterally.  Neurologic  Senn-Weinstein monofilament wire test within normal limits  bilaterally. Muscle power within normal limits bilaterally.  Nails Thick disfigured discolored nails with subungual debris  from hallux to fifth toes bilaterally. No evidence of bacterial infection or drainage bilaterally.  Orthopedic  No limitations of motion  feet .  No crepitus or effusions noted.  No bony pathology or digital deformities noted.  Skin  normotropic skin with no porokeratosis noted bilaterally.  No signs of infections or ulcers noted.   Listers corn left  Onychomycosis  Pain in toes right foot  Pain in toes left foot  Debridement  of nails  1-5  B/L with a nail nipper.  Nails were then filed using a dremel tool with no incidents.    RTC  4 months    Lesieli Bresee DPM  

## 2022-07-03 NOTE — Progress Notes (Unsigned)
[facility] [place]  ASSESSMENT / PLAN: Jill Warren is a 66 y.o. female with chronic venous insufficiency of bilateral lower extremities causing reticular veins (C1 disease).  Venous duplex is significant for small segment of greater saphenous vein reflux in the mid thigh Recommend compression and elevation for symptomatic relief. Follow up with me as needed.  CHIEF COMPLAINT: Reticular veins  HISTORY OF PRESENT ILLNESS: Jill Warren is a 66 y.o. female referred to clinic for evaluation of reticular veins about bilateral lower extremities, right worse than left.  She has noticed reticular veins about her lower extremities since her early adult life and these have never been especially bothersome to her.  She does notice some swelling in her lower extremities, but this is not bothersome to her either, she was hoping to just get "checked out" to make sure nothing is wrong.  We had a long discussion about the natural history of chronic venous insufficiency and the rationale for intervention.  Past Medical History:  Diagnosis Date   Cancer of small intestine ([place]) 2002   High cholesterol    Hypertension    Melanoma ([place]) 1998    Past Surgical History:  Procedure Laterality Date   ankle tendon Left    APPENDECTOMY     WITH MELANOMA SURGERY   KNEE ARTHROSCOPY Left    melanoma removal     Partial bowel resection   TOTAL HIP ARTHROPLASTY Left 04/25/2017   Procedure: LEFT TOTAL HIP ARTHROPLASTY ANTERIOR APPROACH;  Surgeon: Gaynelle Arabian, MD;  Location: [facility];  Service: Orthopedics;  Laterality: Left;    Family History  Problem Relation Age of Onset   CAD Mother 20   Stomach cancer Father    Lymphoma Sister    Congestive Heart Failure Other    Brain cancer Paternal Uncle     Social History   Socioeconomic History   Marital status: Married    Spouse name: Not on file   Number of children: 0   Years of education: HS grad   Highest education  level: Not on file  Occupational History   Occupation: Public house manager: [employer]  Tobacco Use   Smoking status: Never   Smokeless tobacco: Never  Vaping Use   Vaping Use: Never used  Substance and Sexual Activity   Alcohol use: Yes    Comment: rarely   Drug use: No   Sexual activity: Not on file  Other Topics Concern   Not on file  Social History Narrative   Caffeine 2 cups daily avg.   Social Determinants of Health   Financial Resource Strain: Not on file  Food Insecurity: Not on file  Transportation Needs: Not on file  Physical Activity: Not on file  Stress: Not on file  Social Connections: Not on file  Intimate Partner Violence: Not on file    No Known Allergies  Current Outpatient Medications  Medication Sig Dispense Refill   Bepotastine Besilate 1.5 % SOLN Place 1 drop into both eyes daily as needed (irritation).     Cholecalciferol (VITAMIN D) 50 MCG (2000 UT) tablet 5000 u QD     co-enzyme Q-10 30 MG capsule Take 30 mg by mouth 3 (three) times daily.     fluticasone (FLONASE) 50 MCG/ACT nasal spray Place 2 sprays into both nostrils daily.     losartan-hydrochlorothiazide (HYZAAR) 100-12.5 MG tablet Take 1 tablet by mouth daily.     Magnesium 250 MG TABS 1 capsule with a  meal     naproxen sodium (ALEVE) 220 MG tablet Take 220 mg by mouth. Take 1-2 a day for headache     Omega-3 Fatty Acids (FISH OIL) 1000 MG CAPS Take by mouth.     Peppermint Oil (IBGARD PO) Take by mouth.     Probiotic Product (ALIGN) 4 MG CAPS Take 4 mg by mouth daily as needed (stomach health).     rosuvastatin (CRESTOR) 10 MG tablet Take 10 mg by mouth daily.     triazolam (HALCION) 0.25 MG tablet Take 0.0625-0.125 mg by mouth at bedtime. 1/2 tab daily     No current facility-administered medications for this visit.    PHYSICAL EXAM Vitals:   07/04/22 1050  BP: 127/74  Pulse: 60  Resp: 20  Temp: 98.1 F (36.7 C)  SpO2: 99%  Weight: 213 lb 14.4 oz (97 kg)  Height: '5\' 4"'$   (1.626 m)   Well-appearing woman in no acute distress Regular rate and rhythm Unlabored breathing 2+ dorsalis pedis pulses bilaterally Scattered reticular veins about the calves bilaterally  PERTINENT LABORATORY AND RADIOLOGIC DATA  Most recent CBC    Latest Ref Rng & Units 04/26/2017    4:10 AM 04/20/2017   10:00 AM  CBC  WBC 4.0 - 10.5 K/uL 15.8  5.9   Hemoglobin 12.0 - 15.0 g/dL 12.3  13.6   Hematocrit 36.0 - 46.0 % 37.8  40.8   Platelets 150 - 400 K/uL 211  222      Most recent CMP    Latest Ref Rng & Units 04/26/2017    4:10 AM 04/20/2017   10:00 AM  CMP  Glucose 65 - 99 mg/dL 122  97   BUN 6 - 20 mg/dL 13  14   Creatinine 0.44 - 1.00 mg/dL 0.70  0.81   Sodium 135 - 145 mmol/L 139  141   Potassium 3.5 - 5.1 mmol/L 4.5  4.0   Chloride 101 - 111 mmol/L 104  105   CO2 22 - 32 mmol/L 25  29   Calcium 8.9 - 10.3 mg/dL 8.8  9.2   Total Protein 6.5 - 8.1 g/dL  7.3   Total Bilirubin 0.3 - 1.2 mg/dL  0.7   Alkaline Phos 38 - 126 U/L  42   AST 15 - 41 U/L  18   ALT 14 - 54 U/L  16    Left:  - No evidence of deep vein thrombosis seen in the left lower extremity,  from the common femoral through the popliteal veins.  - No evidence of superficial venous thrombosis in the left lower  extremity.  - No evidence of deep vein reflux.  - Superficial vein reflux in the mid thigh GSV.    Yevonne Aline. Stanford Breed, MD [facility] Phone Number: [phone] 07/03/2022 5:28 PM  Total time spent on preparing this encounter including chart review, data review, collecting history, examining the patient, coordinating care for this new patient, 45 minutes.  Portions of this report may have been transcribed using voice recognition software.  Every effort has been made to ensure accuracy; however, inadvertent computerized transcription errors may still be present.

## 2022-07-04 ENCOUNTER — Ambulatory Visit (HOSPITAL_COMMUNITY)
Admission: RE | Admit: 2022-07-04 | Discharge: 2022-07-04 | Disposition: A | Discharge status: Home / Self Care | Payer: Medicare Other | Source: Ambulatory Visit | Attending: Vascular Surgery | Admitting: Vascular Surgery

## 2022-07-04 ENCOUNTER — Encounter: Payer: Self-pay | Admitting: Vascular Surgery

## 2022-07-04 ENCOUNTER — Ambulatory Visit: Payer: Medicare Other | Admitting: Vascular Surgery

## 2022-07-04 VITALS — BP 127/74 | HR 60 | Temp 98.1°F | Resp 20 | Ht 64.0 in | Wt 213.9 lb

## 2022-07-04 DIAGNOSIS — I872 Venous insufficiency (chronic) (peripheral): Secondary | ICD-10-CM

## 2022-10-13 ENCOUNTER — Encounter: Payer: Self-pay | Admitting: Podiatry

## 2022-10-13 ENCOUNTER — Ambulatory Visit: Payer: Medicare Other | Admitting: Podiatry

## 2022-10-13 VITALS — BP 140/62

## 2022-10-13 DIAGNOSIS — B351 Tinea unguium: Secondary | ICD-10-CM

## 2022-10-13 DIAGNOSIS — M79675 Pain in left toe(s): Secondary | ICD-10-CM

## 2022-10-13 DIAGNOSIS — M79674 Pain in right toe(s): Secondary | ICD-10-CM | POA: Diagnosis not present

## 2022-10-13 DIAGNOSIS — I872 Venous insufficiency (chronic) (peripheral): Secondary | ICD-10-CM

## 2022-10-13 NOTE — Progress Notes (Signed)
This patient returns to the office for evaluation and treatment of long thick painful nails .  This patient is unable to trim her own nails since the patient cannot reach the feet.  Patient says the nails are painful walking and wearing his shoes. She says she is having pain at the tip of third toe right foot.She returns for preventive foot care services.  General Appearance  Alert, conversant and in no acute stress.  Vascular  Dorsalis pedis and posterior tibial  pulses are palpable  bilaterally.  Capillary return is within normal limits  bilaterally. Temperature is within normal limits  bilaterally.  Neurologic  Senn-Weinstein monofilament wire test within normal limits  bilaterally. Muscle power within normal limits bilaterally.  Nails Thick disfigured discolored nails with subungual debris  from hallux to fifth toes bilaterally. No evidence of bacterial infection or drainage bilaterally.  Orthopedic  No limitations of motion  feet .  No crepitus or effusions noted.  No bony pathology or digital deformities noted.  Skin  normotropic skin with no porokeratosis noted bilaterally.  No signs of infections or ulcers noted.   Listers corn left  Onychomycosis  Pain in toes right foot  Pain in toes left foot  Debridement  of nails  1-5  B/L with a nail nipper.  Nails were then filed using a dremel tool with no incidents.    RTC  4 months    Gardiner Barefoot DPM

## 2023-02-16 ENCOUNTER — Ambulatory Visit: Payer: Medicare Other | Admitting: Podiatry

## 2023-02-16 ENCOUNTER — Encounter: Payer: Self-pay | Admitting: Podiatry

## 2023-02-16 DIAGNOSIS — B351 Tinea unguium: Secondary | ICD-10-CM | POA: Diagnosis not present

## 2023-02-16 DIAGNOSIS — M79674 Pain in right toe(s): Secondary | ICD-10-CM

## 2023-02-16 DIAGNOSIS — M79675 Pain in left toe(s): Secondary | ICD-10-CM | POA: Diagnosis not present

## 2023-02-16 DIAGNOSIS — I872 Venous insufficiency (chronic) (peripheral): Secondary | ICD-10-CM

## 2023-02-16 NOTE — Progress Notes (Signed)
This patient returns to the office for evaluation and treatment of long thick painful nails .  This patient is unable to trim her own nails since the patient cannot reach the feet.  Patient says the nails are painful walking and wearing his shoes. She says she is having pain at the tip of third toe right foot.She returns for preventive foot care services.  General Appearance  Alert, conversant and in no acute stress.  Vascular  Dorsalis pedis and posterior tibial  pulses are palpable  bilaterally.  Capillary return is within normal limits  bilaterally. Temperature is within normal limits  bilaterally.  Neurologic  Senn-Weinstein monofilament wire test within normal limits  bilaterally. Muscle power within normal limits bilaterally.  Nails Thick disfigured discolored nails with subungual debris  from hallux to fifth toes bilaterally. No evidence of bacterial infection or drainage bilaterally.  Orthopedic  No limitations of motion  feet .  No crepitus or effusions noted.  No bony pathology or digital deformities noted.  Skin  normotropic skin with no porokeratosis noted bilaterally.  No signs of infections or ulcers noted.   Listers corn left  Onychomycosis  Pain in toes right foot  Pain in toes left foot  Debridement  of nails  1-5  B/L with a nail nipper.  Nails were then filed using a dremel tool with no incidents.    RTC  14 weeks    Helane Gunther DPM

## 2023-05-21 ENCOUNTER — Ambulatory Visit: Payer: Medicare Other | Admitting: Podiatry

## 2023-05-21 ENCOUNTER — Encounter: Payer: Self-pay | Admitting: Podiatry

## 2023-05-21 DIAGNOSIS — B351 Tinea unguium: Secondary | ICD-10-CM

## 2023-05-21 DIAGNOSIS — M79674 Pain in right toe(s): Secondary | ICD-10-CM

## 2023-05-21 DIAGNOSIS — I872 Venous insufficiency (chronic) (peripheral): Secondary | ICD-10-CM | POA: Diagnosis not present

## 2023-05-21 DIAGNOSIS — M79675 Pain in left toe(s): Secondary | ICD-10-CM

## 2023-05-21 NOTE — Progress Notes (Signed)
This patient returns to the office for evaluation and treatment of long thick painful nails .  This patient is unable to trim her own nails since the patient cannot reach the feet.  Patient says the nails are painful walking and wearing his shoes. She says she is having pain at the tip of third toe right foot.She returns for preventive foot care services.  General Appearance  Alert, conversant and in no acute stress.  Vascular  Dorsalis pedis and posterior tibial  pulses are palpable  bilaterally.  Capillary return is within normal limits  bilaterally. Temperature is within normal limits  bilaterally.  Neurologic  Senn-Weinstein monofilament wire test within normal limits  bilaterally. Muscle power within normal limits bilaterally.  Nails Thick disfigured discolored nails with subungual debris  from hallux to fifth toes bilaterally. No evidence of bacterial infection or drainage bilaterally. Subungual blister right hallux.  Orthopedic  No limitations of motion  feet .  No crepitus or effusions noted.  No bony pathology or digital deformities noted.  Skin  normotropic skin with no porokeratosis noted bilaterally.  No signs of infections or ulcers noted.   Clavi 3rd toe right foot.  Onychomycosis  Pain in toes right foot  Pain in toes left foot  Debridement  of nails  1-5  B/L with a nail nipper.  Nails were then filed using a dremel tool with no incidents.    RTC  12 weeks    Helane Gunther DPM

## 2023-08-22 ENCOUNTER — Ambulatory Visit: Payer: Medicare Other | Admitting: Podiatry

## 2023-08-22 ENCOUNTER — Encounter: Payer: Self-pay | Admitting: Podiatry

## 2023-08-22 DIAGNOSIS — M79674 Pain in right toe(s): Secondary | ICD-10-CM

## 2023-08-22 DIAGNOSIS — B351 Tinea unguium: Secondary | ICD-10-CM | POA: Diagnosis not present

## 2023-08-22 DIAGNOSIS — M79675 Pain in left toe(s): Secondary | ICD-10-CM

## 2023-08-22 DIAGNOSIS — I872 Venous insufficiency (chronic) (peripheral): Secondary | ICD-10-CM

## 2023-08-22 NOTE — Progress Notes (Signed)
This patient returns to the office for evaluation and treatment of long thick painful nails .  This patient is unable to trim her own nails since the patient cannot reach the feet.  Patient says the nails are painful walking and wearing his shoes. She says she is having pain at the tip of third toe right foot.She returns for preventive foot care services.  General Appearance  Alert, conversant and in no acute stress.  Vascular  Dorsalis pedis and posterior tibial  pulses are palpable  bilaterally.  Capillary return is within normal limits  bilaterally. Temperature is within normal limits  bilaterally.  Neurologic  Senn-Weinstein monofilament wire test within normal limits  bilaterally. Muscle power within normal limits bilaterally.  Nails Thick disfigured discolored nails with subungual debris  from hallux to fifth toes bilaterally. No evidence of bacterial infection or drainage bilaterally. Subungual blister right hallux.  Orthopedic  No limitations of motion  feet .  No crepitus or effusions noted.  No bony pathology or digital deformities noted.  Skin  normotropic skin with no porokeratosis noted bilaterally.  No signs of infections or ulcers noted.   Clavi 3rd toe right foot.  Onychomycosis  Pain in toes right foot  Pain in toes left foot  Debridement  of nails  1-5  B/L with a nail nipper.  Nails were then filed using a dremel tool with no incidents.    RTC  12 weeks    Helane Gunther DPM

## 2023-11-29 ENCOUNTER — Ambulatory Visit: Payer: Medicare Other | Admitting: Podiatry

## 2023-11-30 ENCOUNTER — Ambulatory Visit: Payer: Medicare Other | Admitting: Podiatry

## 2023-12-03 ENCOUNTER — Encounter: Payer: Self-pay | Admitting: Podiatry

## 2023-12-03 ENCOUNTER — Ambulatory Visit: Payer: Medicare Other | Admitting: Podiatry

## 2023-12-03 DIAGNOSIS — I872 Venous insufficiency (chronic) (peripheral): Secondary | ICD-10-CM

## 2023-12-03 DIAGNOSIS — M79674 Pain in right toe(s): Secondary | ICD-10-CM | POA: Diagnosis not present

## 2023-12-03 DIAGNOSIS — B351 Tinea unguium: Secondary | ICD-10-CM | POA: Diagnosis not present

## 2023-12-03 DIAGNOSIS — M79675 Pain in left toe(s): Secondary | ICD-10-CM | POA: Diagnosis not present

## 2023-12-03 NOTE — Progress Notes (Signed)
This patient returns to the office for evaluation and treatment of long thick painful nails .  This patient is unable to trim her own nails since the patient cannot reach the feet.  Patient says the nails are painful walking and wearing his shoes. She says she is having pain at the tip of third toe right foot.She returns for preventive foot care services.  General Appearance  Alert, conversant and in no acute stress.  Vascular  Dorsalis pedis and posterior tibial  pulses are palpable  bilaterally.  Capillary return is within normal limits  bilaterally. Temperature is within normal limits  bilaterally.  Neurologic  Senn-Weinstein monofilament wire test within normal limits  bilaterally. Muscle power within normal limits bilaterally.  Nails Thick disfigured discolored nails with subungual debris  from hallux to fifth toes bilaterally. No evidence of bacterial infection or drainage bilaterally. Subungual blister right hallux.  Orthopedic  No limitations of motion  feet .  No crepitus or effusions noted.  No bony pathology or digital deformities noted.  Skin  normotropic skin with no porokeratosis noted bilaterally.  No signs of infections or ulcers noted.   Clavi 3rd toe right foot.  Onychomycosis  Pain in toes right foot  Pain in toes left foot  Debridement  of nails  1-5  B/L with a nail nipper.  Nails were then filed using a dremel tool with no incidents.    RTC  12 weeks    Helane Gunther DPM

## 2024-03-06 ENCOUNTER — Encounter: Payer: Self-pay | Admitting: Radiology

## 2024-03-06 ENCOUNTER — Encounter: Payer: Self-pay | Admitting: Podiatry

## 2024-03-06 ENCOUNTER — Ambulatory Visit: Payer: Medicare Other | Admitting: Podiatry

## 2024-03-06 DIAGNOSIS — M79674 Pain in right toe(s): Secondary | ICD-10-CM

## 2024-03-06 DIAGNOSIS — M79675 Pain in left toe(s): Secondary | ICD-10-CM | POA: Diagnosis not present

## 2024-03-06 DIAGNOSIS — B351 Tinea unguium: Secondary | ICD-10-CM

## 2024-03-06 DIAGNOSIS — I872 Venous insufficiency (chronic) (peripheral): Secondary | ICD-10-CM | POA: Diagnosis not present

## 2024-03-06 NOTE — Progress Notes (Signed)
 This patient returns to the office for evaluation and treatment of long thick painful nails .  This patient is unable to trim her own nails since the patient cannot reach the feet.  Patient says the nails are painful walking and wearing his shoes. She says she is having pain at the tip of third toe right foot.She returns for preventive foot care services.  General Appearance  Alert, conversant and in no acute stress.  Vascular  Dorsalis pedis and posterior tibial  pulses are palpable  bilaterally.  Capillary return is within normal limits  bilaterally. Temperature is within normal limits  bilaterally.  Neurologic  Senn-Weinstein monofilament wire test within normal limits  bilaterally. Muscle power within normal limits bilaterally.  Nails Thick disfigured discolored nails with subungual debris  from hallux to fifth toes bilaterally. No evidence of bacterial infection or drainage bilaterally. Subungual blister right hallux.  Orthopedic  No limitations of motion  feet .  No crepitus or effusions noted.  No bony pathology or digital deformities noted.  Skin  normotropic skin with no porokeratosis noted bilaterally.  No signs of infections or ulcers noted.     Onychomycosis  Pain in toes right foot  Pain in toes left foot  Debridement  of nails  1-5  B/L with a nail nipper.  Nails were then filed using a dremel tool with no incidents.    RTC  12 weeks    Ruffin Cotton DPM

## 2024-04-29 ENCOUNTER — Ambulatory Visit: Attending: Internal Medicine | Admitting: Internal Medicine

## 2024-04-29 ENCOUNTER — Encounter: Payer: Self-pay | Admitting: Internal Medicine

## 2024-04-29 VITALS — BP 136/72

## 2024-04-29 DIAGNOSIS — G8929 Other chronic pain: Secondary | ICD-10-CM

## 2024-04-29 DIAGNOSIS — R0609 Other forms of dyspnea: Secondary | ICD-10-CM | POA: Diagnosis not present

## 2024-04-29 DIAGNOSIS — I10 Essential (primary) hypertension: Secondary | ICD-10-CM

## 2024-04-29 DIAGNOSIS — E669 Obesity, unspecified: Secondary | ICD-10-CM

## 2024-04-29 DIAGNOSIS — R519 Headache, unspecified: Secondary | ICD-10-CM

## 2024-04-29 DIAGNOSIS — I872 Venous insufficiency (chronic) (peripheral): Secondary | ICD-10-CM

## 2024-04-29 DIAGNOSIS — R072 Precordial pain: Secondary | ICD-10-CM | POA: Diagnosis not present

## 2024-04-29 DIAGNOSIS — E7800 Pure hypercholesterolemia, unspecified: Secondary | ICD-10-CM

## 2024-04-29 DIAGNOSIS — I2510 Atherosclerotic heart disease of native coronary artery without angina pectoris: Secondary | ICD-10-CM

## 2024-04-29 MED ORDER — METOPROLOL TARTRATE 50 MG PO TABS: ORAL_TABLET | ORAL | 0 refills | Status: AC

## 2024-04-29 NOTE — Patient Instructions (Signed)
 Medication Instructions:  Continue current medications *If you need a refill on your cardiac medications before your next appointment, please call your pharmacy*  Lab Work: none If you have labs (blood work) drawn today and your tests are completely normal, you will receive your results only by: MyChart Message (if you have MyChart) OR A paper copy in the mail If you have any lab test that is abnormal or we need to change your treatment, we will call you to review the results.  Testing/Procedures: Echo Your physician has requested that you have an echocardiogram. Echocardiography is a painless test that uses sound waves to create images of your heart. It provides your doctor with information about the size and shape of your heart and how well your heart's chambers and valves are working. This procedure takes approximately one hour. There are no restrictions for this procedure. Please do NOT wear cologne, perfume, aftershave, or lotions (deodorant is allowed). Please arrive 15 minutes prior to your appointment time.  Please note: We ask at that you not bring children with you during ultrasound (echo/ vascular) testing. Due to room size and safety concerns, children are not allowed in the ultrasound rooms during exams. Our front office staff cannot provide observation of children in our lobby area while testing is being conducted. An adult accompanying a patient to their appointment will only be allowed in the ultrasound room at the discretion of the ultrasound technician under special circumstances. We apologize for any inconvenience.   Follow-Up: At [facility], you and your health needs are our priority.  As part of our continuing mission to provide you with exceptional heart care, our providers are all part of one team.  This team includes your primary Cardiologist (physician) and Advanced Practice Providers or APPs (Physician Assistants and Nurse Practitioners) who all work together  to provide you with the care you need, when you need it.  Your next appointment:   3 month(s)  Provider:   Dr. Chancy Comber  We recommend signing up for the patient portal called MyChart.  Sign up information is provided on this After Visit Summary.  MyChart is used to connect with patients for Virtual Visits (Telemedicine).  Patients are able to view lab/test results, encounter notes, upcoming appointments, etc.  Non-urgent messages can be sent to your provider as well.   To learn more about what you can do with MyChart, go to [email or web address].   Other Instructions   Your cardiac CT will be scheduled at one of the below locations:     Jeralene Mom. [facility] [address]  [place], [place] [address] Opening March 10, 2024    All radiology patients and guests should use entrance C2 at [facility], accessed from [facility], even though the hospital's physical address listed is [address]    If scheduled at the Heart and Vascular Tower at [address], please enter the parking lot using the [address] entrance and use the FREE valet service at the patient drop-off area. Enter the buidling and check-in with registration on the main floor.   Please follow these instructions carefully (unless otherwise directed):  An IV will be required for this test and Nitroglycerin will be given.   On the Night Before the Test: Be sure to Drink plenty of water . Do not consume any caffeinated/decaffeinated beverages or chocolate 12 hours prior to your test. Do not take any antihistamines 12 hours prior to your test.  On the  Day of the Test: Drink plenty of water  until 1 hour prior to the test. Do not eat any food 1 hour prior to test. You may take your regular medications prior to the test.  Take metoprolol (Lopressor)  50mg  two hours prior to test. If you take Furosemide/Hydrochlorothiazide/Spironolactone/Chlorthalidone, please  HOLD on the morning of the test. Patients who wear a continuous glucose monitor MUST remove the device prior to scanning. FEMALES- please wear underwire-free bra if available, avoid dresses & tight clothing  After the Test: Drink plenty of water . After receiving IV contrast, you may experience a mild flushed feeling. This is normal. On occasion, you may experience a mild rash up to 24 hours after the test. This is not dangerous. If this occurs, you can take Benadryl  25 mg, Zyrtec, Claritin, or Allegra and increase your fluid intake. (Patients taking Tikosyn should avoid Benadryl , and may take Zyrtec, Claritin, or Allegra) If you experience trouble breathing, this can be serious. If it is severe call 911 IMMEDIATELY. If it is mild, please call our office.  We will call to schedule your test 2-4 weeks out understanding that some insurance companies will need an authorization prior to the service being performed.   For more information and frequently asked questions, please visit our website : [email or web address]  For non-scheduling related questions, please contact the cardiac imaging nurse navigator should you have any questions/concerns: Cardiac Imaging Nurse Navigators Direct Office Dial: [phone]   For scheduling needs, including cancellations and rescheduling, please call [place], [phone].    Referral to [facility]

## 2024-04-29 NOTE — Progress Notes (Signed)
 Cardiology Office Note:  .   Date:  04/29/2024  ID:  Jill Warren, DOB [birth year], MRN [id number] PCP: Yolande Toribio MATSU, MD  [facility] Providers Cardiologist:  None    History of Present Illness: .   Jill Warren is a 68 y.o. female.  Discussed the use of AI scribe software for clinical note transcription with the patient, who gave verbal consent to proceed.  History of Present Illness Previously seen by Dr. Lavona for evaluation of chest pain.  Chest pain began two weeks ago, described as discomfort initially thought to be heartburn, with some relief from Mylanta. A previous exercise treadmill test was negative three years ago. Shortness of breath occurs when climbing stairs, requiring a pause to catch her breath, but not when walking two miles at the [facility] two to three times a week. Increased shortness of breath is noted with activities involving arm use, such as cutting in the kitchen.  Hypertension is managed with losartan  and Hyzaar, with well-controlled blood pressure today, though she does not regularly monitor it at home. She takes rosuvastatin 10 mg daily for hyperlipidemia and coronary calcification, with LAD coronary calcifications noted on a chest CT.  She is experiencing increased stress and grief following her sister's recent passing, impacting her emotionally.  Her mother is also my patient, and presents for the visit today.     ROS: negative except per HPI above.  Studies Reviewed: Jill Warren   EKG Interpretation Date/Time:  Tuesday April 29 2024 10:00:30 EDT Ventricular Rate:  68 PR Interval:  138 QRS Duration:  78 QT Interval:  382 QTC Calculation: 406 R Axis:   44  Text Interpretation: Normal sinus rhythm Normal ECG When compared with ECG of 20-Apr-2017 10:14, No significant change was found Confirmed by Jill Warren ([id number]) on 04/29/2024 10:09:22 AM    Results LABS LDL: 76 mg/dL  RADIOLOGY Chest CT: LAD coronary  calcifications  DIAGNOSTIC EKG: Normal sinus rhythm (04/29/2024) Exercise treadmill: Negative (2022) Risk Assessment/Calculations:       Physical Exam:   VS:  BP 136/72 (BP Location: Left Arm)    Wt Readings from Last 3 Encounters:  07/04/22 213 lb 14.4 oz (97 kg)  04/05/21 210 lb (95.3 kg)  01/21/19 201 lb 4.5 oz (91.3 kg)     Physical Exam  GENERAL: Alert, cooperative, well developed, no acute distress HEENT: Normocephalic, normal oropharynx, moist mucous membranes CHEST: Clear to auscultation bilaterally, No wheezes, rhonchi, or crackles CARDIOVASCULAR: Normal heart rate and rhythm, S1 and S2 normal without murmurs ABDOMEN: Soft, non-tender, non-distended, without organomegaly, Normal bowel sounds EXTREMITIES: No cyanosis or edema NEUROLOGICAL: Cranial nerves grossly intact, Moves all extremities without gross motor or sensory deficit   ASSESSMENT AND PLAN: .    Assessment and Plan Assessment & Plan Chest pain DOE Coronary artery calcification Coronary calcification in the left anterior descending artery noted. Symptoms include exertional dyspnea and occasional angina. Previous exercise treadmill test was negative but limited by hip pain. Discussed benefits of coronary CT scan and echocardiogram.  - Order coronary CT scan with contrast to assess blockages. - Administer a single dose of metoprolol  two hours before the CT scan to lower heart rate. - Order echocardiogram to evaluate cardiac function and valves. - Schedule both tests within the next several weeks.  Hypertension Blood pressure well-controlled with losartan  and Hyzaar. Occasional hot flashes possibly related to blood pressure fluctuations. Not maintaining a blood pressure log. - Continue losartan  and Hyzaar 100/12.5 mg daily. - Instruct her  to maintain a blood pressure log over the next several weeks. - Check blood pressure during symptoms such as hot flashes and headaches.  Chronic headache Chronic  headache for 11 years, possibly related to cervical issues. Episodic in nature. Previous neurological evaluation showed no significant findings. Discussed potential rebound headaches and renal/gastrointestinal concerns with Aleve use. - Advise her to take a break from daily Aleve use to assess impact on headaches. - Monitor blood pressure during headache episodes to assess correlation.  Hyperlipidemia Hyperlipidemia managed with rosuvastatin. LDL levels acceptable at 76 mg/dL. Plan to reassess lipid levels after coronary CT scan results. - Continue rosuvastatin 10 mg daily. - Reassess lipid levels after coronary CT scan results.  Obesity Attempting weight loss through diet and exercise. Previous success with Weight Watchers. Interested in reducing sweets. Discussed potential for weight loss medications if needed. - Refer to [facility] program for dietary and exercise support. - Encourage reduction of sweets in diet.       Jill Merck, MD, FACC

## 2024-04-30 ENCOUNTER — Encounter (INDEPENDENT_AMBULATORY_CARE_PROVIDER_SITE_OTHER): Payer: Self-pay

## 2024-06-05 ENCOUNTER — Encounter (HOSPITAL_COMMUNITY): Payer: Self-pay

## 2024-06-06 ENCOUNTER — Ambulatory Visit: Admitting: Podiatry

## 2024-06-06 ENCOUNTER — Encounter: Payer: Self-pay | Admitting: Podiatry

## 2024-06-06 DIAGNOSIS — M79675 Pain in left toe(s): Secondary | ICD-10-CM

## 2024-06-06 DIAGNOSIS — M79674 Pain in right toe(s): Secondary | ICD-10-CM | POA: Diagnosis not present

## 2024-06-06 DIAGNOSIS — B351 Tinea unguium: Secondary | ICD-10-CM

## 2024-06-06 DIAGNOSIS — I872 Venous insufficiency (chronic) (peripheral): Secondary | ICD-10-CM

## 2024-06-06 NOTE — Progress Notes (Signed)
 This patient returns to the office for evaluation and treatment of long thick painful nails .  This patient is unable to trim her own nails since the patient cannot reach the feet.  Patient says the nails are painful walking and wearing his shoes. She says she is having pain at the tip of third toe right foot.She returns for preventive foot care services.  General Appearance  Alert, conversant and in no acute stress.  Vascular  Dorsalis pedis and posterior tibial  pulses are palpable  bilaterally.  Capillary return is within normal limits  bilaterally. Temperature is within normal limits  bilaterally.  Neurologic  Senn-Weinstein monofilament wire test within normal limits  bilaterally. Muscle power within normal limits bilaterally.  Nails Thick disfigured discolored nails with subungual debris  from hallux to fifth toes bilaterally. No evidence of bacterial infection or drainage bilaterally. Subungual blister right hallux.  Orthopedic  No limitations of motion  feet .  No crepitus or effusions noted.  No bony pathology or digital deformities noted.  Skin  normotropic skin with no porokeratosis noted bilaterally.  No signs of infections or ulcers noted.     Onychomycosis  Pain in toes right foot  Pain in toes left foot  Debridement  of nails  1-5  B/L with a nail nipper.  Nails were then filed using a dremel tool with no incidents.    RTC  12 weeks    Ruffin Cotton DPM

## 2024-06-10 ENCOUNTER — Ambulatory Visit (HOSPITAL_COMMUNITY)
Admission: RE | Admit: 2024-06-10 | Discharge: 2024-06-10 | Disposition: A | Discharge status: Home / Self Care | Source: Ambulatory Visit | Attending: Internal Medicine

## 2024-06-10 DIAGNOSIS — R072 Precordial pain: Secondary | ICD-10-CM | POA: Diagnosis not present

## 2024-06-10 DIAGNOSIS — I2510 Atherosclerotic heart disease of native coronary artery without angina pectoris: Secondary | ICD-10-CM | POA: Insufficient documentation

## 2024-06-10 DIAGNOSIS — R0609 Other forms of dyspnea: Secondary | ICD-10-CM | POA: Insufficient documentation

## 2024-06-10 MED ORDER — NITROGLYCERIN 0.4 MG SL SUBL
0.8000 mg | SUBLINGUAL_TABLET | Freq: Once | SUBLINGUAL | Status: AC
  Administered 2024-06-10: 0.8 mg via SUBLINGUAL

## 2024-06-10 MED ORDER — IOHEXOL 350 MG/ML SOLN
100.0000 mL | Freq: Once | INTRAVENOUS | Status: AC | PRN
  Administered 2024-06-10: 100 mL via INTRAVENOUS

## 2024-06-11 ENCOUNTER — Ambulatory Visit: Payer: Self-pay | Admitting: Internal Medicine

## 2024-06-12 ENCOUNTER — Ambulatory Visit (HOSPITAL_COMMUNITY)
Admission: RE | Admit: 2024-06-12 | Discharge: 2024-06-12 | Disposition: A | Discharge status: Home / Self Care | Source: Ambulatory Visit | Attending: Cardiology | Admitting: Cardiology

## 2024-06-12 DIAGNOSIS — R072 Precordial pain: Secondary | ICD-10-CM | POA: Diagnosis present

## 2024-06-12 DIAGNOSIS — R0609 Other forms of dyspnea: Secondary | ICD-10-CM | POA: Insufficient documentation

## 2024-06-12 LAB — ECHOCARDIOGRAM COMPLETE
Area-P 1/2: 4.19 cm2
S' Lateral: 2.6 cm

## 2024-07-28 ENCOUNTER — Encounter: Payer: Self-pay | Admitting: *Deleted

## 2024-07-30 ENCOUNTER — Ambulatory Visit: Attending: Internal Medicine | Admitting: Internal Medicine

## 2024-07-30 ENCOUNTER — Encounter: Payer: Self-pay | Admitting: Internal Medicine

## 2024-07-30 VITALS — BP 128/82 | HR 74 | Ht 63.0 in | Wt 231.0 lb

## 2024-07-30 DIAGNOSIS — K219 Gastro-esophageal reflux disease without esophagitis: Secondary | ICD-10-CM

## 2024-07-30 DIAGNOSIS — I2510 Atherosclerotic heart disease of native coronary artery without angina pectoris: Secondary | ICD-10-CM

## 2024-07-30 DIAGNOSIS — I10 Essential (primary) hypertension: Secondary | ICD-10-CM | POA: Diagnosis not present

## 2024-07-30 DIAGNOSIS — E7800 Pure hypercholesterolemia, unspecified: Secondary | ICD-10-CM

## 2024-07-30 DIAGNOSIS — E669 Obesity, unspecified: Secondary | ICD-10-CM

## 2024-07-30 DIAGNOSIS — R600 Localized edema: Secondary | ICD-10-CM

## 2024-07-30 DIAGNOSIS — R0609 Other forms of dyspnea: Secondary | ICD-10-CM

## 2024-07-30 NOTE — Progress Notes (Signed)
 Cardiology Office Note:  .   Date:  07/30/2024  ID:  FAYELYNN DISTEL, DOB 06-Feb-1956, MRN [id number] PCP: Yolande Toribio MATSU, MD  [facility] Providers Cardiologist:  None    History of Present Illness: .   COTINA FREEDMAN is a 68 y.o. female.  Discussed the use of AI scribe software for clinical note transcription with the patient, who gave verbal consent to proceed.  History of Present Illness LATAYA VARNELL is a 68 year old female with coronary artery disease who presents for follow-up regarding her cardiovascular health.  She experiences episodes of facial flushing and warmth, attributed to postmenopausal symptoms, without palpitations or other associated symptoms. Blood pressure remains normal during these episodes.  Chest discomfort and shortness of breath have improved since June. Shortness of breath now occurs only with exertion, such as walking on an incline. No current chest pressure, pain, or tightness is present.  A recent coronary CT scan shows mild blockages in the LAD without significant flow limitations. She is on rosuvastatin 10 mg and losartan  HCTZ. Her LDL cholesterol was 76 mg/dL in August.  She experiences occasional swelling and tightness in both calves, alleviated by leg elevation. No recent dietary changes contributing to this are noted.    ROS: negative except per HPI above.  Studies Reviewed: .        Results LABS LDL: 76 mg/dL (91/7974)  RADIOLOGY Coronary CT: Moderate plaque and calcification in coronary arteries, mild stenosis in LAD, normal perfusion (04/2024)  DIAGNOSTIC Echocardiogram: Normal systolic function, normal diastolic function, normal myocardial health, normal right ventricular function, mild mitral regurgitation, normal aortic valve function, normal intracardiac pressures (04/2024) Risk Assessment/Calculations:       Physical Exam:   VS:  BP 128/82   Pulse 74   Ht 5' 3 (1.6 m)   Wt 231 lb (104.8 kg)   SpO2  99%   BMI 40.92 kg/m    Wt Readings from Last 3 Encounters:  07/30/24 231 lb (104.8 kg)  07/04/22 213 lb 14.4 oz (97 kg)  04/05/21 210 lb (95.3 kg)     Physical Exam VITALS: BP- 128/82 GENERAL: Alert, cooperative, well developed, no acute distress HEENT: Normocephalic, normal oropharynx, moist mucous membranes CHEST: Clear to auscultation bilaterally, no wheezes, rhonchi, or crackles CARDIOVASCULAR: Normal heart rate and rhythm, S1 and S2 normal without murmurs ABDOMEN: Soft, non-tender, non-distended, without organomegaly, normal bowel sounds EXTREMITIES: No cyanosis or edema NEUROLOGICAL: Cranial nerves grossly intact, moves all extremities without gross motor or sensory deficit   ASSESSMENT AND PLAN: .    Assessment and Plan Assessment & Plan Coronary artery disease with mild LAD blockages Coronary CT shows mild LAD blockages with moderate plaque and calcium. Echocardiogram normal. - Continue rosuvastatin 10 mg daily. - Consider increasing rosuvastatin to 20 mg daily to lower LDL and reduce cardiovascular risk. - Monitor for muscle aches or cramps. - Discuss with primary care physician before increasing dose.  Hyperlipidemia LDL cholesterol at 76 mg/dL in August, within target. Current rosuvastatin 10 mg effective. - Consider increasing rosuvastatin to 20 mg daily. - Monitor for muscle aches or cramps.  Hypertension Blood pressure controlled at 128/82 mmHg with losartan  HCTZ. - Continue losartan  HCTZ. - Monitor blood pressure regularly.  Mild mitral valve regurgitation Echocardiogram shows mild regurgitation, normal heart function. - Re-evaluate in three years or sooner if symptoms worsen.  Lower extremity edema and varicose veins Bilateral edema likely due to venous insufficiency and varicose veins. Unlikely to be due to DVT  as both legs are affected. - Recommend low-strength compression socks to start. - Encourage leg elevation. - Increase physical  activity.  Obesity Weight management issues, possibly due to dietary habits and physical activity levels. - Encourage dietary modifications focusing on high-protein, nutrient-dense foods. - Consider referral to a nutritionist. - Increase physical activity as tolerated.  Gastroesophageal reflux disease (GERD) Reports acid reflux symptoms, possibly related to dietary habits. - Consider dietary modifications to reduce reflux symptoms.      Soyla Merck, MD, FACC

## 2024-07-30 NOTE — Patient Instructions (Addendum)
 Medication Instructions:  Your physician has recommended you make the following change in your medication:  Increase Crestor to 20mg  daily. Speak with your Primary Care provider and call us  back if you would like to continue this dose.  Lab Work: None ordered.  You may go to any [facility] Location for your lab work:  [facility] - [address] ([facility]) - [address] [phone] [address]  [place] - [address]   [place]  - [address]   [place] - [address] - [address] [employer]  - [address] ([facility]   If you have labs (blood work) drawn today and your tests are completely normal, you will receive your results only by: [employer] (if you have MyChart)  If you have any lab test that is abnormal or we need to change your treatment, we will call you or send a MyChart message to review the results.  Testing/Procedures: None ordered.  Follow-Up: At [facility], you and your health needs are our priority.  As part of our continuing mission to provide you with exceptional heart care, we have created designated Provider Care Teams.  These Care Teams include your primary Cardiologist (physician) and Advanced Practice Providers (APPs -  Physician Assistants and Nurse Practitioners) who all work together to provide you with the care you need, when you need it.  Your next appointment:   1 year(s)  The format for your next appointment:   In Person  Provider:   Soyla Merck, MD

## 2024-08-05 LAB — LAB REPORT - SCANNED: EGFR: 62.3

## 2024-09-04 ENCOUNTER — Ambulatory Visit: Payer: Self-pay | Admitting: Internal Medicine

## 2024-09-04 ENCOUNTER — Ambulatory Visit: Admitting: Podiatry

## 2024-09-04 ENCOUNTER — Encounter: Payer: Self-pay | Admitting: Podiatry

## 2024-09-04 DIAGNOSIS — M79675 Pain in left toe(s): Secondary | ICD-10-CM

## 2024-09-04 DIAGNOSIS — I872 Venous insufficiency (chronic) (peripheral): Secondary | ICD-10-CM | POA: Diagnosis not present

## 2024-09-04 DIAGNOSIS — B351 Tinea unguium: Secondary | ICD-10-CM | POA: Diagnosis not present

## 2024-09-04 DIAGNOSIS — M79674 Pain in right toe(s): Secondary | ICD-10-CM

## 2024-09-04 NOTE — Progress Notes (Signed)
 This patient returns to the office for evaluation and treatment of long thick painful nails .  This patient is unable to trim her own nails since the patient cannot reach the feet.  Patient says the nails are painful walking and wearing his shoes. She says she is having pain at the tip of third toe right foot.She returns for preventive foot care services.  General Appearance  Alert, conversant and in no acute stress.  Vascular  Dorsalis pedis and posterior tibial  pulses are palpable  bilaterally.  Capillary return is within normal limits  bilaterally. Temperature is within normal limits  bilaterally.  Neurologic  Senn-Weinstein monofilament wire test within normal limits  bilaterally. Muscle power within normal limits bilaterally.  Nails Thick disfigured discolored nails with subungual debris  from hallux to fifth toes bilaterally. No evidence of bacterial infection or drainage bilaterally. Subungual blister right hallux.  Orthopedic  No limitations of motion  feet .  No crepitus or effusions noted.  No bony pathology or digital deformities noted.  Skin  normotropic skin with no porokeratosis noted bilaterally.  No signs of infections or ulcers noted.     Onychomycosis  Pain in toes right foot  Pain in toes left foot  Debridement  of nails  1-5  B/L with a nail nipper.  Nails were then filed using a dremel tool with no incidents.    RTC  12 weeks    Ruffin Cotton DPM

## 2024-11-18 ENCOUNTER — Encounter: Payer: Self-pay | Admitting: Internal Medicine

## 2024-12-05 ENCOUNTER — Encounter: Payer: Self-pay | Admitting: Podiatry

## 2024-12-05 ENCOUNTER — Ambulatory Visit: Admitting: Podiatry

## 2024-12-05 DIAGNOSIS — B351 Tinea unguium: Secondary | ICD-10-CM | POA: Diagnosis not present

## 2024-12-05 DIAGNOSIS — M79675 Pain in left toe(s): Secondary | ICD-10-CM

## 2024-12-05 DIAGNOSIS — I872 Venous insufficiency (chronic) (peripheral): Secondary | ICD-10-CM | POA: Diagnosis not present

## 2024-12-05 DIAGNOSIS — M79674 Pain in right toe(s): Secondary | ICD-10-CM | POA: Diagnosis not present

## 2024-12-05 NOTE — Progress Notes (Signed)
 This patient returns to the office for evaluation and treatment of long thick painful nails .  This patient is unable to trim her own nails since the patient cannot reach the feet.  Patient says the nails are painful walking and wearing his shoes. She says she is having pain at the tip of third toe right foot.She returns for preventive foot care services.  General Appearance  Alert, conversant and in no acute stress.  Vascular  Dorsalis pedis and posterior tibial  pulses are palpable  bilaterally.  Capillary return is within normal limits  bilaterally. Temperature is within normal limits  bilaterally.  Neurologic  Senn-Weinstein monofilament wire test within normal limits  bilaterally. Muscle power within normal limits bilaterally.  Nails Thick disfigured discolored nails with subungual debris  from hallux to fifth toes bilaterally. No evidence of bacterial infection or drainage bilaterally.  Orthopedic  No limitations of motion  feet .  No crepitus or effusions noted.  No bony pathology or digital deformities noted.  Skin  normotropic skin with no porokeratosis noted bilaterally.  No signs of infections or ulcers noted.     Onychomycosis  Pain in toes right foot  Pain in toes left foot  Debridement  of nails  1-5  B/L with a nail nipper.  Nails were then filed using a dremel tool with no incidents.    RTC  12 weeks    Cordella Bold DPM

## 2025-01-29 ENCOUNTER — Ambulatory Visit: Admitting: Internal Medicine

## 2025-03-06 ENCOUNTER — Ambulatory Visit: Admitting: Podiatry
